# Patient Record
Sex: Male | Born: 1970 | Race: White | Hispanic: No | Marital: Married | Smoking: Never smoker
Health system: Southern US, Community
[De-identification: ages and names within clinical notes are randomized; demographics above are authoritative.]

## PROBLEM LIST (undated history)

## (undated) DIAGNOSIS — E7800 Pure hypercholesterolemia, unspecified: Secondary | ICD-10-CM

## (undated) DIAGNOSIS — L709 Acne, unspecified: Secondary | ICD-10-CM

## (undated) DIAGNOSIS — G4730 Sleep apnea, unspecified: Secondary | ICD-10-CM

## (undated) DIAGNOSIS — E559 Vitamin D deficiency, unspecified: Secondary | ICD-10-CM

## (undated) DIAGNOSIS — K219 Gastro-esophageal reflux disease without esophagitis: Secondary | ICD-10-CM

## (undated) DIAGNOSIS — G4700 Insomnia, unspecified: Secondary | ICD-10-CM

## (undated) DIAGNOSIS — J309 Allergic rhinitis, unspecified: Secondary | ICD-10-CM

## (undated) DIAGNOSIS — M7020 Olecranon bursitis, unspecified elbow: Secondary | ICD-10-CM

## (undated) HISTORY — PX: WISDOM TOOTH EXTRACTION: SHX21

## (undated) HISTORY — DX: Acne, unspecified: L70.9

## (undated) HISTORY — DX: Vitamin D deficiency, unspecified: E55.9

## (undated) HISTORY — DX: Sleep apnea, unspecified: G47.30

## (undated) HISTORY — DX: Olecranon bursitis, unspecified elbow: M70.20

## (undated) HISTORY — DX: Gastro-esophageal reflux disease without esophagitis: K21.9

## (undated) HISTORY — DX: Pure hypercholesterolemia, unspecified: E78.00

## (undated) HISTORY — DX: Allergic rhinitis, unspecified: J30.9

## (undated) HISTORY — DX: Insomnia, unspecified: G47.00

## (undated) HISTORY — PX: INGUINAL HERNIA REPAIR: SUR1180

---

## 2008-06-30 ENCOUNTER — Ambulatory Visit: Payer: Self-pay | Admitting: Radiology

## 2008-06-30 ENCOUNTER — Emergency Department (HOSPITAL_BASED_OUTPATIENT_CLINIC_OR_DEPARTMENT_OTHER): Admission: EM | Admit: 2008-06-30 | Discharge: 2008-06-30 | Payer: Self-pay | Admitting: Emergency Medicine

## 2010-07-21 IMAGING — CR DG RIBS W/ CHEST 3+V*R*
3 series · 3 of 3 positions shown · non-contrast
Comparison: None

CLINICAL DATA: Blunt trauma to right posterior chest/short of
breath

RIGHT RIBS AND CHEST - 3+ VIEW

[w chest pa]
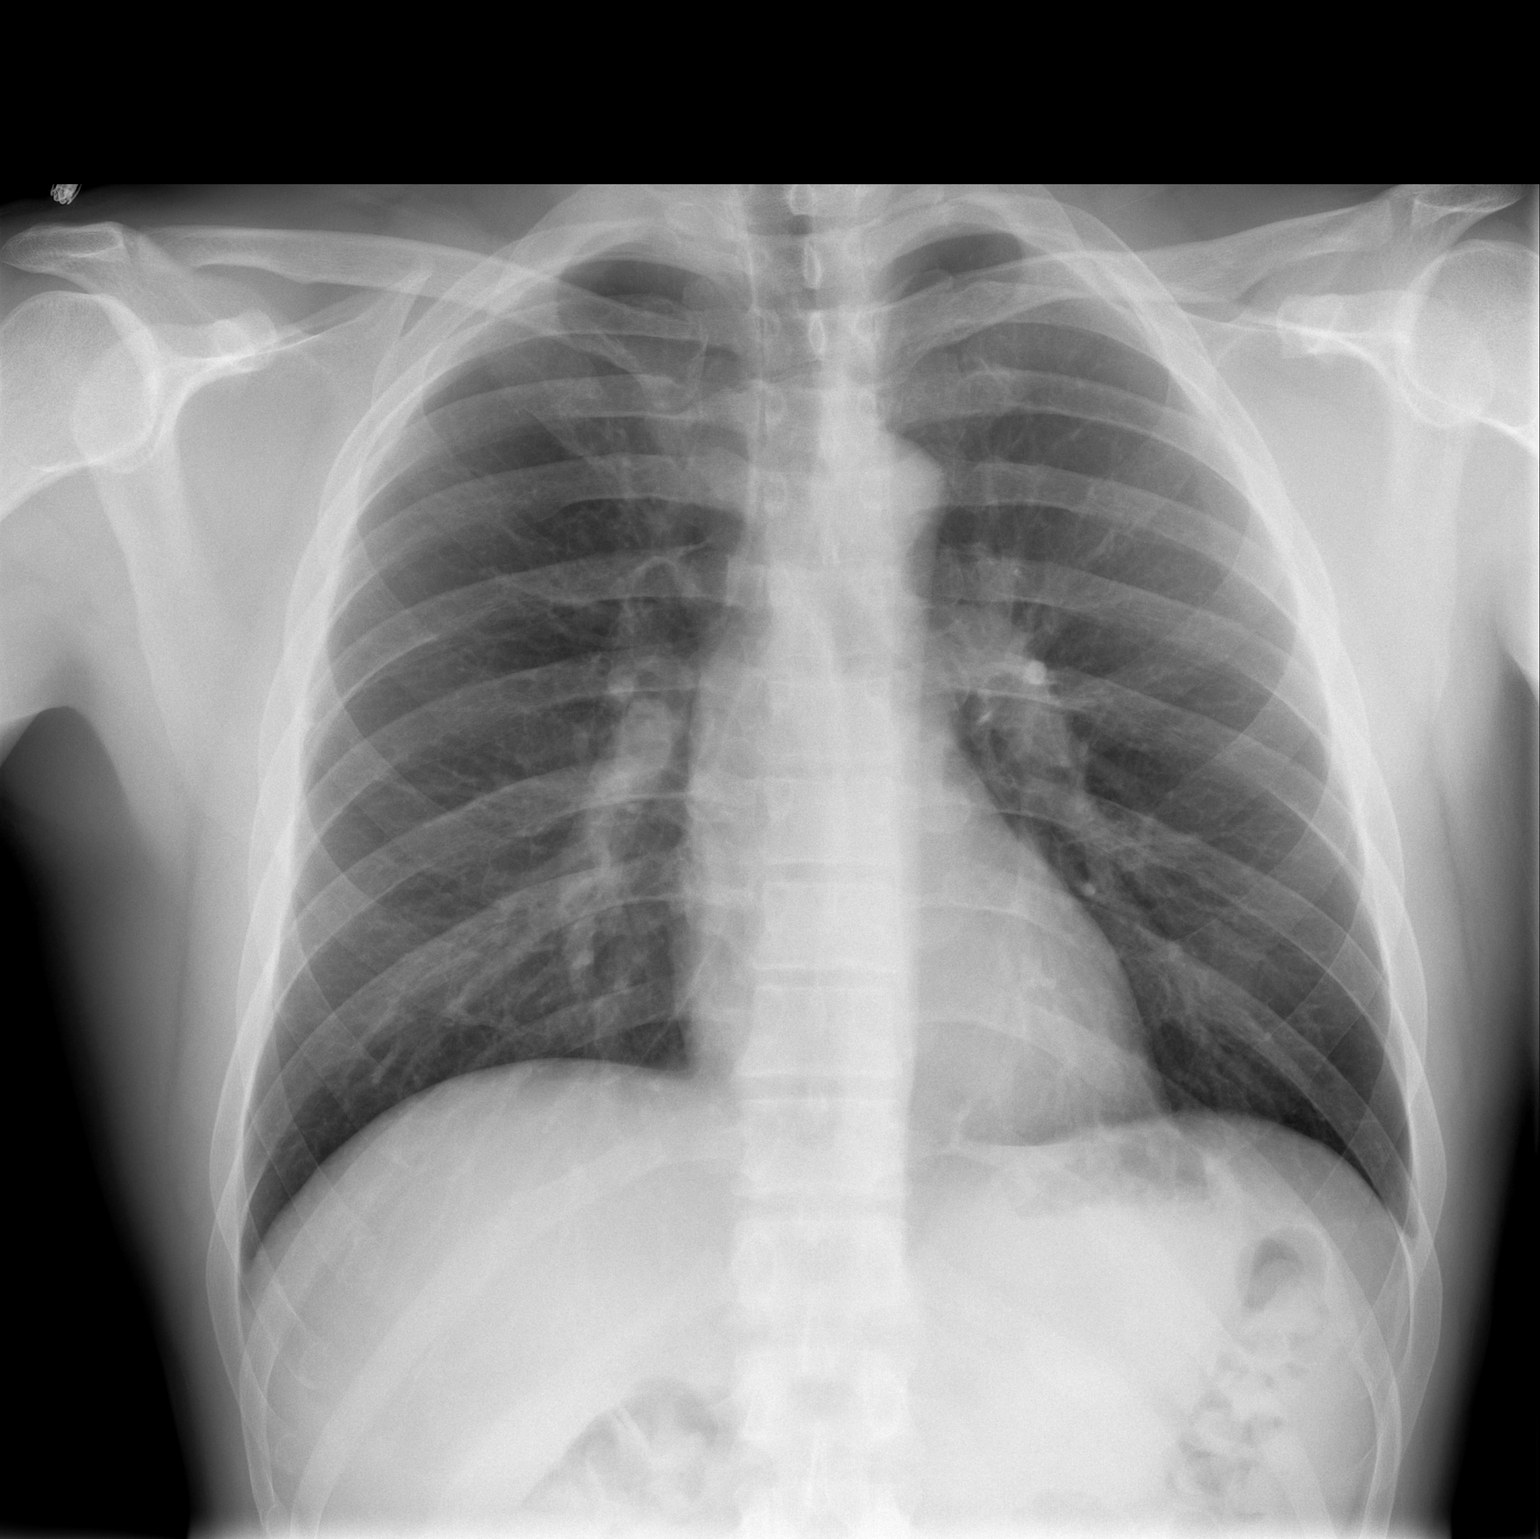

[w ribs ap/pa upper right]
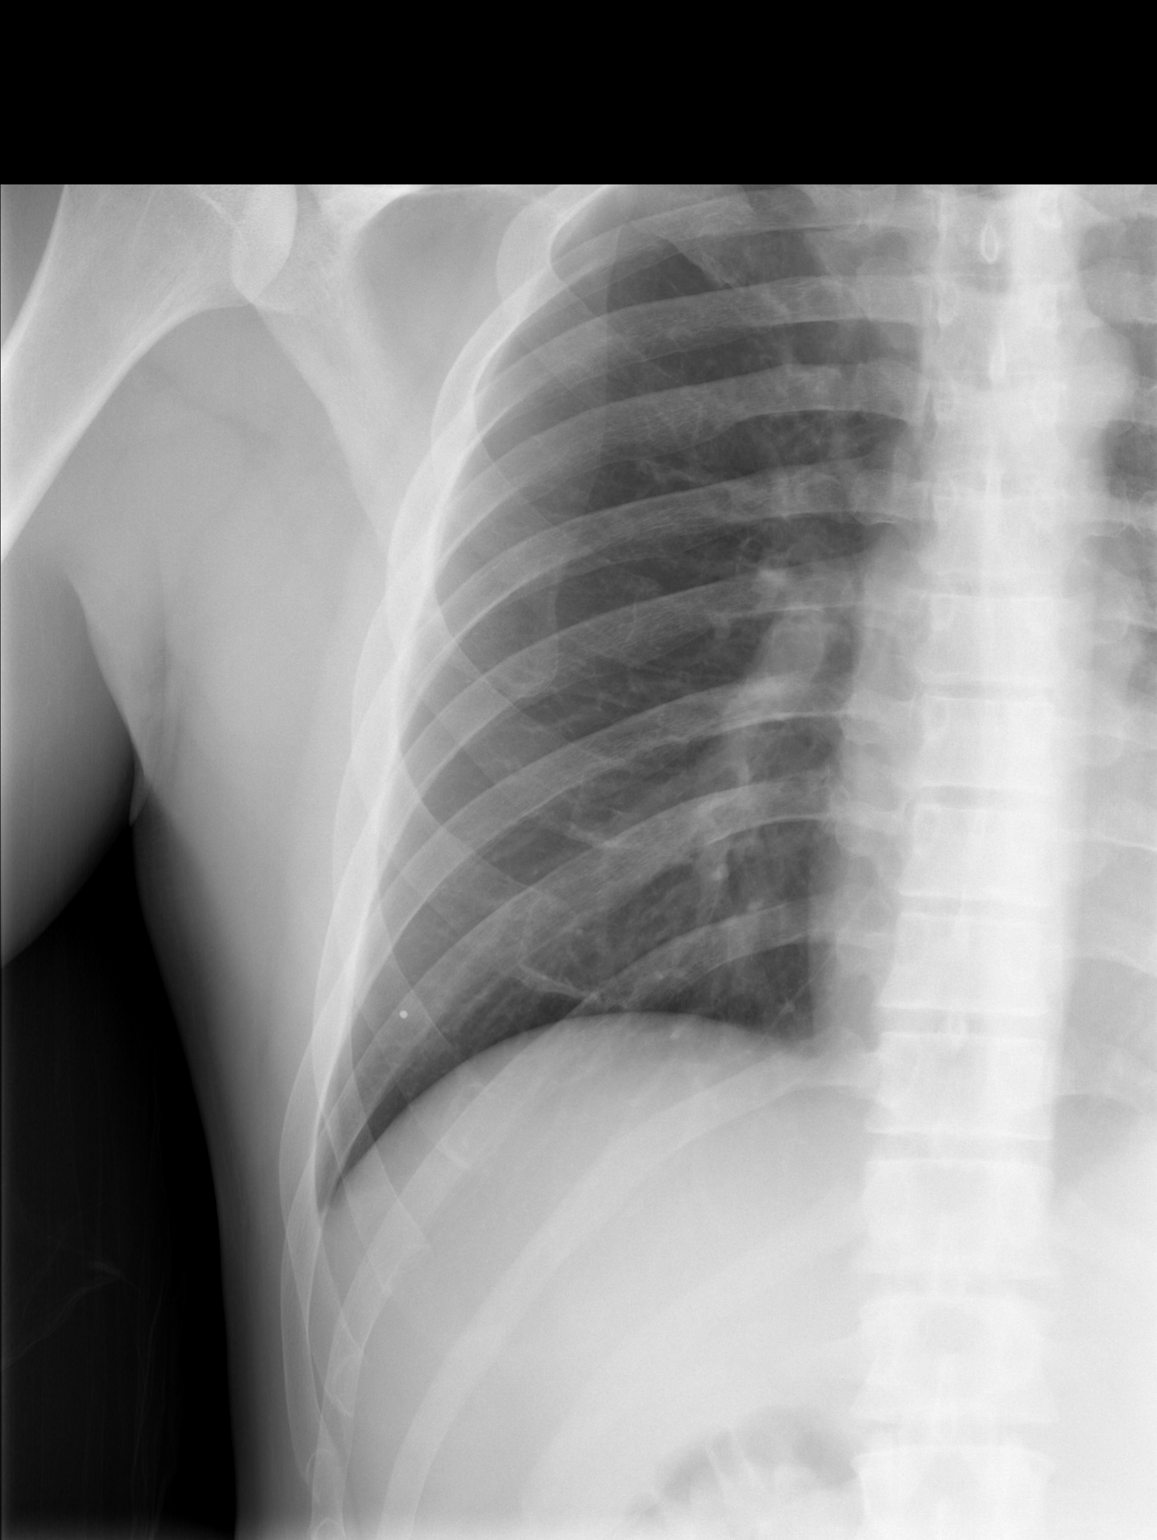

[w ribs oblique right]
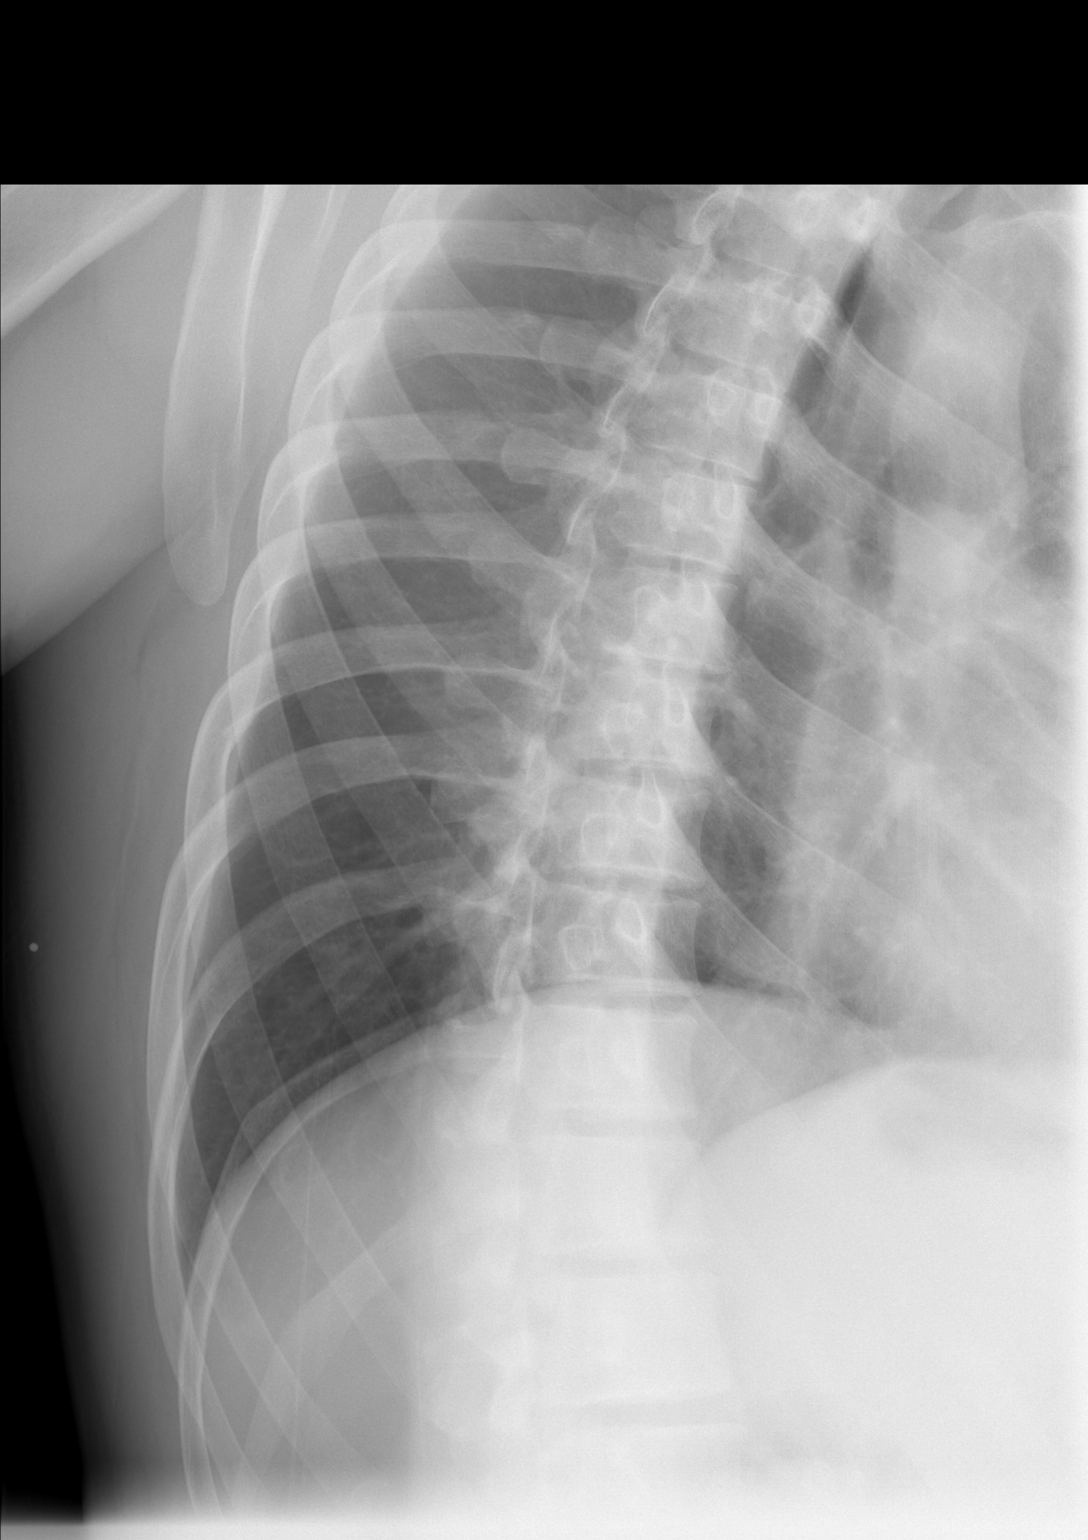

[3 of 3 positions shown; findings below may reference images not displayed]

FINDINGS: Heart and lungs normal.  No rib fractures.  No
pneumothorax or hemothorax.
IMPRESSION: No acute or significant findings.

## 2016-12-05 DIAGNOSIS — L700 Acne vulgaris: Secondary | ICD-10-CM | POA: Diagnosis not present

## 2017-01-03 DIAGNOSIS — Z111 Encounter for screening for respiratory tuberculosis: Secondary | ICD-10-CM | POA: Diagnosis not present

## 2017-02-07 DIAGNOSIS — L700 Acne vulgaris: Secondary | ICD-10-CM | POA: Diagnosis not present

## 2017-02-14 DIAGNOSIS — L700 Acne vulgaris: Secondary | ICD-10-CM | POA: Diagnosis not present

## 2017-04-15 DIAGNOSIS — L700 Acne vulgaris: Secondary | ICD-10-CM | POA: Diagnosis not present

## 2017-05-09 DIAGNOSIS — M9903 Segmental and somatic dysfunction of lumbar region: Secondary | ICD-10-CM | POA: Diagnosis not present

## 2017-05-09 DIAGNOSIS — M9901 Segmental and somatic dysfunction of cervical region: Secondary | ICD-10-CM | POA: Diagnosis not present

## 2017-05-09 DIAGNOSIS — M9905 Segmental and somatic dysfunction of pelvic region: Secondary | ICD-10-CM | POA: Diagnosis not present

## 2017-05-09 DIAGNOSIS — M9902 Segmental and somatic dysfunction of thoracic region: Secondary | ICD-10-CM | POA: Diagnosis not present

## 2017-05-14 DIAGNOSIS — M9905 Segmental and somatic dysfunction of pelvic region: Secondary | ICD-10-CM | POA: Diagnosis not present

## 2017-05-14 DIAGNOSIS — M9902 Segmental and somatic dysfunction of thoracic region: Secondary | ICD-10-CM | POA: Diagnosis not present

## 2017-05-14 DIAGNOSIS — M9903 Segmental and somatic dysfunction of lumbar region: Secondary | ICD-10-CM | POA: Diagnosis not present

## 2017-05-14 DIAGNOSIS — M9901 Segmental and somatic dysfunction of cervical region: Secondary | ICD-10-CM | POA: Diagnosis not present

## 2017-06-23 DIAGNOSIS — I83813 Varicose veins of bilateral lower extremities with pain: Secondary | ICD-10-CM | POA: Diagnosis not present

## 2017-06-25 DIAGNOSIS — M545 Low back pain: Secondary | ICD-10-CM | POA: Diagnosis not present

## 2017-06-25 DIAGNOSIS — G4700 Insomnia, unspecified: Secondary | ICD-10-CM | POA: Diagnosis not present

## 2017-06-25 DIAGNOSIS — R0683 Snoring: Secondary | ICD-10-CM | POA: Diagnosis not present

## 2017-06-25 DIAGNOSIS — Z0000 Encounter for general adult medical examination without abnormal findings: Secondary | ICD-10-CM | POA: Diagnosis not present

## 2017-07-01 DIAGNOSIS — M79605 Pain in left leg: Secondary | ICD-10-CM | POA: Diagnosis not present

## 2017-07-01 DIAGNOSIS — I83811 Varicose veins of right lower extremities with pain: Secondary | ICD-10-CM | POA: Diagnosis not present

## 2017-07-01 DIAGNOSIS — I83813 Varicose veins of bilateral lower extremities with pain: Secondary | ICD-10-CM | POA: Diagnosis not present

## 2017-07-01 DIAGNOSIS — M79604 Pain in right leg: Secondary | ICD-10-CM | POA: Diagnosis not present

## 2017-07-02 DIAGNOSIS — M9901 Segmental and somatic dysfunction of cervical region: Secondary | ICD-10-CM | POA: Diagnosis not present

## 2017-07-02 DIAGNOSIS — M9905 Segmental and somatic dysfunction of pelvic region: Secondary | ICD-10-CM | POA: Diagnosis not present

## 2017-07-02 DIAGNOSIS — M9902 Segmental and somatic dysfunction of thoracic region: Secondary | ICD-10-CM | POA: Diagnosis not present

## 2017-07-02 DIAGNOSIS — M9903 Segmental and somatic dysfunction of lumbar region: Secondary | ICD-10-CM | POA: Diagnosis not present

## 2017-07-22 DIAGNOSIS — I83813 Varicose veins of bilateral lower extremities with pain: Secondary | ICD-10-CM | POA: Diagnosis not present

## 2017-07-29 DIAGNOSIS — D1801 Hemangioma of skin and subcutaneous tissue: Secondary | ICD-10-CM | POA: Diagnosis not present

## 2017-07-29 DIAGNOSIS — L918 Other hypertrophic disorders of the skin: Secondary | ICD-10-CM | POA: Diagnosis not present

## 2017-08-25 DIAGNOSIS — M9903 Segmental and somatic dysfunction of lumbar region: Secondary | ICD-10-CM | POA: Diagnosis not present

## 2017-08-25 DIAGNOSIS — M9905 Segmental and somatic dysfunction of pelvic region: Secondary | ICD-10-CM | POA: Diagnosis not present

## 2017-08-25 DIAGNOSIS — M9901 Segmental and somatic dysfunction of cervical region: Secondary | ICD-10-CM | POA: Diagnosis not present

## 2017-08-25 DIAGNOSIS — M9902 Segmental and somatic dysfunction of thoracic region: Secondary | ICD-10-CM | POA: Diagnosis not present

## 2017-09-09 ENCOUNTER — Encounter: Payer: Self-pay | Admitting: Neurology

## 2017-09-09 ENCOUNTER — Ambulatory Visit (INDEPENDENT_AMBULATORY_CARE_PROVIDER_SITE_OTHER): Payer: BLUE CROSS/BLUE SHIELD | Admitting: Neurology

## 2017-09-09 VITALS — BP 123/72 | HR 50 | Ht 74.0 in | Wt 194.0 lb

## 2017-09-09 DIAGNOSIS — R0683 Snoring: Secondary | ICD-10-CM | POA: Insufficient documentation

## 2017-09-09 DIAGNOSIS — F5103 Paradoxical insomnia: Secondary | ICD-10-CM | POA: Diagnosis not present

## 2017-09-09 NOTE — Progress Notes (Signed)
SLEEP MEDICINE CLINIC   Provider:  Melvyn Novas, M D  Primary Care Physician:  Dorothyann Peng, MD   Referring Provider: Dorothyann Peng, MD    Chief Complaint  Patient presents with  . New Patient (Initial Visit)    pt alone, rm 11. pt states that he doesnt have difficulty with going to sleep but is struggling staying asleep. pt states that he typically wakes up during the night and then has a hard time with going back to sleep. pt has been told he snores in sleep    HPI:  Curtis [name] is a 47 y.o. caucasian, right handed  male patient and seen here  in a referral from Dr. Allyne Gee for a sleep medicine consultation.   Chief complaint according to patient : "Increasingly fatigued, and sleepy. I have wake up early and can't go back to sleep ". He works for [employer] as a Psychologist, counselling.   Mr. Pellicane reports that he has noted certain cycles or phases of sleep changes.  There will be times where he wakes up at the exact time every morning for example 3; 40 minutes AM and after a couple of weeks or so this again will change.  It has never been difficult for him to go to sleep in the first place but if he goes to early he also wakes up early.  He craves to be able to sleep 7 or 8 hours but this has rarely happened. He hasn't slept in daytime , has never been a "napper".  Sleep habits are as follows: The patient aims for a bedtime between 10 and 11 PM, if he would go to sleep earlier he would wake up earlier and he always avoids going to sleep later than midnight. He feels pretty good at this routine. Bedroom is cool , quiet and dark. He shares the bedroom with his wife, but he feels usually better when alone in bed. He sleeps poorly in hotels.  Kids are 8 and 11, no pets in the bedroom. Bathroom break will wake him up some days, mostly around 3 or 4 AM.  He has vivid dreams- but feels these don't wake him, no pain or discomfort that interrupts his sleep. Used to have some low back pain that has  resolved.   No enactment, no sleep . Wife reports snoring, mildly and more after alcohol ( weekends ) and only on his back.  He wakes at 6. 30 AM spontaneously. He is always able to wake without alarm. He feels refreshed some days, only after at least 6 hours of sleep. Often he is fatigued.   Sleep medical history and family sleep history: father was a snorer, lost weight- CPAP user. Mother is a Agricultural consultant. One brother who is  younger by 3.5 years sleeps well.    Social history: he practices good sleep hygiene. He doesn't have electronics in the bedroom- married, 2 children in school age. Sales rep , travels by car.  Non smoker, exerciser, ETOH- wine or beer on weekends, 2-3 beers per WE evening, caffeine - no soda, 2 cups of coffee in AM, sweet tea at lunch.     Review of Systems: Out of a complete 14 system review, the patient complains of only the following symptoms, and all other reviewed systems are negative. Snoring, early morning waking up- 10 out of 14 days.   Epworth score 2/ 24  , Fatigue severity score 23/ 63 , depression score n/a    Social History  Socioeconomic History  . Marital status: Married    Spouse name: Not on file  . Number of children: Not on file  . Years of education: Not on file  . Highest education level: Not on file  Occupational History  . Not on file  Social Needs  . Financial resource strain: Not on file  . Food insecurity:    Worry: Not on file    Inability: Not on file  . Transportation needs:    Medical: Not on file    Non-medical: Not on file  Tobacco Use  . Smoking status: Never Smoker  . Smokeless tobacco: Never Used  Substance and Sexual Activity  . Alcohol use: Not on file  . Drug use: Never  . Sexual activity: Not on file  Lifestyle  . Physical activity:    Days per week: Not on file    Minutes per session: Not on file  . Stress: Not on file  Relationships  . Social connections:    Talks on phone: Not on file    Gets  together: Not on file    Attends religious service: Not on file    Active member of club or organization: Not on file    Attends meetings of clubs or organizations: Not on file    Relationship status: Not on file  . Intimate partner violence:    Fear of current or ex partner: Not on file    Emotionally abused: Not on file    Physically abused: Not on file    Forced sexual activity: Not on file  Other Topics Concern  . Not on file  Social History Narrative  . Not on file    Family History  Problem Relation Age of Onset  . Hypertension Father   . High Cholesterol Father     Past Medical History:  Diagnosis Date  . Acne   . Allergic rhinitis   . GERD (gastroesophageal reflux disease)   . Insomnia   . Olecranon bursitis   . Pure hypercholesterolemia   . Vitamin D deficiency     Past Surgical History:  Procedure Laterality Date  . INGUINAL HERNIA REPAIR Right   . WISDOM TOOTH EXTRACTION      Current Outpatient Medications  Medication Sig Dispense Refill  . Cholecalciferol (VITAMIN D) 2000 units CAPS Take 1 capsule by mouth daily.    . Omega-3 Fatty Acids (FISH OIL) 500 MG CAPS Take 1 capsule by mouth daily.    . rosuvastatin (CRESTOR) 5 MG tablet Take 5 mg by mouth daily.    . temazepam (RESTORIL) 15 MG capsule Take 15 mg by mouth at bedtime.     No current facility-administered medications for this visit.     Allergies as of 09/09/2017  . (No Known Allergies)    Vitals: BP 123/72   Pulse (!) 50   Ht 6\' 2"  (1.88 m)   Wt 194 lb (88 kg)   BMI 24.91 kg/m  Last Weight:  Wt Readings from Last 1 Encounters:  09/09/17 194 lb (88 kg)   WJX:BJYNBMI:Body mass index is 24.91 kg/m.     Last Height:   Ht Readings from Last 1 Encounters:  09/09/17 6\' 2"  (1.88 m)    Physical exam:  General: The patient is awake, alert and appears not in acute distress. The patient is well groomed. Head: Normocephalic, atraumatic. Neck is supple. Mallampati 3,  neck circumference:16" Nasal  airflow patent ,  no Retrognathia is seen.  Cardiovascular:  Regular rate  and rhythm , without  murmurs or carotid bruit, and without distended neck veins. Respiratory: Lungs are clear to auscultation. Skin:  Without evidence of edema, or rash Trunk: BMI is 24.9. The patient's posture is erect. Neurologic exam : The patient is awake and alert, oriented to place and time.   Memory subjective  described as intact.  Attention span & concentration ability appears normal.  Speech is fluent,  without dysarthria, dysphonia or aphasia.  Mood and affect are appropriate.  Cranial nerves: Pupils are equal and briskly reactive to light. Funduscopic exam without  evidence of pallor or edema. Extraocular movements  in vertical and horizontal planes intact and without nystagmus. Visual fields by finger perimetry are intact. Hearing to finger rub intact.  Facial sensation intact to fine touch. Facial motor strength is symmetric and tongue and uvula move midline. Shoulder shrug was symmetrical.   Motor exam:   Normal tone, muscle bulk and symmetric strength in all extremities. Sensory:  Fine touch, pinprick and vibration were tested in all extremities.  Coordination: Rapid alternating movements / Finger-to-nose maneuver  normal without evidence of ataxia, dysmetria or tremor. Gait and station: Patient walks without assistive device. Deep tendon reflexes: in the  upper and lower extremities are symmetric and intact.    Assessment:  After physical and neurologic examination, review of laboratory studies,  Personal review of imaging studies, reports of other /same  Imaging studies, results of polysomnography and / or neurophysiology testing and pre-existing records as far as provided in visit., my assessment is   1)  Early awakening insomnia is present on more days than not. Usually in early AM-  Melatonin has helped a little- but he only noted improvement with temazepam.   2)  Organic - physiological  reasons for early waking . - attended sleep study.  Rule out apnea, hypoxia and PLms.   The patient was advised of the nature of the diagnosed disorder , the treatment options and the  risks for general health and wellness arising from not treating the condition.   I spent more than 45 minutes of face to face time with the patient.  Greater than 50% of time was spent in counseling and coordination of care. We have discussed the diagnosis and differential and I answered the patient's questions.    Plan:  Treatment plan and additional workup :  Attended sleep study to evaluate insomnia, arousals related to snoring, OSA, pain, PLMs, hypoxemia, cardiac arrhythmia etc. .    Melvyn Novas, MD 09/09/2017, 9:21 AM  Certified in Neurology by ABPN Certified in Sleep Medicine by [facility] [address] [place], [place] [address]

## 2017-09-29 ENCOUNTER — Ambulatory Visit (INDEPENDENT_AMBULATORY_CARE_PROVIDER_SITE_OTHER): Payer: BLUE CROSS/BLUE SHIELD | Admitting: Neurology

## 2017-09-29 DIAGNOSIS — G4731 Primary central sleep apnea: Secondary | ICD-10-CM

## 2017-09-29 DIAGNOSIS — R0683 Snoring: Secondary | ICD-10-CM

## 2017-09-29 DIAGNOSIS — F5103 Paradoxical insomnia: Secondary | ICD-10-CM

## 2017-10-03 NOTE — Procedures (Signed)
PATIENT'S NAME:  Curtis [name], Curtis [name] DOB:      1970/06/19      MRN:    [id number]     DATE OF RECORDING: 09/29/2017/ [facility] REFERRING M.D.:  Dorothyann Peng, M.D. Study Performed:   Baseline Polysomnogram with expanded EEG montage  HISTORY:  47 year old Caucasian male with increasing fatigue and sleepiness, reports problems to stay asleep, has tried OTC and prescription sleep aids.   The patient endorsed the Epworth Sleepiness Scale at 2/24 points, FSS at 23/63 points.   The patient's weight 194 pounds with a height of 74 (inches), resulting in a BMI of 24.9 kg/m2. The patient's neck circumference measured 16 inches.  CURRENT MEDICATIONS: Vitamin D, Omega 3, Crestor, Restoril.   PROCEDURE:  This is a multichannel digital polysomnogram utilizing the Somnostar 11.2 system.  Electrodes and sensors were applied and monitored per AASM Specifications.   EEG, EOG, Chin and Limb EMG, were sampled at 200 Hz.  ECG, Snore and Nasal Pressure, Thermal Airflow, Respiratory Effort, CPAP Flow and Pressure, Oximetry was sampled at 50 Hz. Digital video and audio were recorded.      BASELINE STUDY: Lights Out was at 21:08 and Lights On at 05:02.  Total recording time (TRT) was 474.5 minutes, with a total sleep time (TST) of 390 minutes.   The patient's sleep latency was 67.5 minutes.  REM latency was 118 minutes.  The sleep efficiency was 82.2 %.     SLEEP ARCHITECTURE: WASO (Wake after sleep onset) was 35 minutes.  There were 6 minutes in Stage N1, 269.5 minutes Stage N2, 67.5 minutes Stage N3 and 47 minutes in Stage REM.  The percentage of Stage N1 was 1.5%, Stage N2 was 69.1%, Stage N3 was 17.3% and Stage R (REM sleep) was 12.1%.   RESPIRATORY ANALYSIS:  There were a total of 114 respiratory events:  10 obstructive apneas, 58 central apneas and 2 mixed apneas with a total of 70 apneas and an apnea index (AI) of 10.8 /hour. There were 44 hypopneas with a hypopnea index of 6.8 /hour. The patient also had 0  respiratory event related arousals (RERAs). The total APNEA/HYPOPNEA INDEX (AHI) was 17.5/hour and the total RESPIRATORY DISTURBANCE INDEX was 17.5 /hour.  2 events occurred in REM sleep and 145 events in NREM. The REM AHI was 2.6 /hour, versus a non-REM AHI of 19.6. The patient spent 199.5 minutes of total sleep time in the supine position and 191 minutes in non-supine. The supine AHI was 28.5/h, versus a non-supine AHI of 6.0.  OXYGEN SATURATION & C02:  The Wake baseline 02 saturation was 94%, with the lowest being 87%. Time spent below 89% saturation equaled 4 minutes. Average End Tidal CO2 during sleep was not recorded.   PERIODIC LIMB MOVEMENTS:   The patient had a total of 12 Periodic Limb Movements.  The Periodic Limb Movement (PLM) index was 1.8 and the PLM Arousal index was 0.75 /hour. The arousals were noted as: 72 were spontaneous, 5 were associated with PLMs, and 48 were associated with respiratory events.  Audio and video analysis did not show any abnormal or unusual movements, behaviors, phonations or vocalizations. Snoring was noted. We were unable to see leg movements for much of the second half of the sleep study due to artefact.   EKG was bradycardic in keeping with sinus rhythm (Sinus-brady). Sinus bradycardia at 40 bpm was evident in supine NREM sleep with frequent cyclic central apneas.   Post-study, the patient indicated that sleep was the  same as usual.   IMPRESSION: No prolonged periods of wakefulness during sleep study.   1. 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 Almost no apnea and hypopnea in REM sleep. This is a pattern of Central Sleep Apnea and associated with bradycardia. This was not a response to hypoxemia- there was no significant or prolonged hypoxia noted.  2. Mild Periodic Limb Movement Disorder (PLMD). 3. Primary Snoring in supine sleep.  4. Abnormally slow EKG.   RECOMMENDATIONS:  1. Advise work up for U[employer] CorporationComplex Central Apnea, cyclic breathing,  bradycardia. This involves a return for a PAP evaluation, and avoiding supine sleep in the meantime.    I certify that I have reviewed the entire raw data recording prior to the issuance of this report in accordance with the Standards of Accreditation of the American Academy of Sleep Medicine (AASM)    Melvyn Novasarmen Grant Henkes, MD      10-03-2017  Diplomat, American Board of Psychiatry and Neurology  Diplomat, American Board of Sleep Medicine Medical Director, [facility]

## 2017-10-03 NOTE — Addendum Note (Signed)
Addended by: Melvyn NovasHMEIER, Darnell Stimson on: 10/03/2017 12:51 PM   Modules accepted: Orders

## 2017-10-08 ENCOUNTER — Telehealth: Payer: Self-pay | Admitting: Neurology

## 2017-10-08 NOTE — Telephone Encounter (Signed)
Called patient to discuss sleep study results. No answer at this time. LVM for the patient to call back.   

## 2017-10-08 NOTE — Telephone Encounter (Signed)
Pt returned call. I advised pt that Dr. Dohmeier reviewed their sleep study results and found that has complex sleep apnea and recommends that pt be treated with a cpap. Dr. Dohmeier recommends that pt return for a repeat sleep study in order to properly titrate the cpap and ensure a good mask fit. Pt is agreeable to returning for a titration study. I advised pt that our sleep lab will file with pt's insurance and call pt to schedule the sleep study when we hear back from the pt's insurance regarding coverage of this sleep study. Pt verbalized understanding of results. Pt had no questions at this time but was encouraged to call back if questions arise.   

## 2017-10-08 NOTE — Telephone Encounter (Signed)
-----   Message from Melvyn Novas, MD sent at 10/03/2017 12:51 PM EDT ----- IMPRESSION: No prolonged periods of wakefulness during sleep  study.   1. 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 Almost no  apnea and hypopnea in REM sleep. This is a pattern of Central  Sleep Apnea and associated with bradycardia. This was not a  response to hypoxemia- there was no significant or prolonged  hypoxia noted.  2. Mild Periodic Limb Movement Disorder (PLMD). 3. Primary Snoring in supine sleep.  4. Abnormally slow EKG.   RECOMMENDATIONS:  1. Advise work up for [employer] Apnea, cyclic breathing,  bradycardia. This involves a return for a PAP evaluation, and  avoiding supine sleep in the meantime.

## 2017-10-13 DIAGNOSIS — M9903 Segmental and somatic dysfunction of lumbar region: Secondary | ICD-10-CM | POA: Diagnosis not present

## 2017-10-13 DIAGNOSIS — M9902 Segmental and somatic dysfunction of thoracic region: Secondary | ICD-10-CM | POA: Diagnosis not present

## 2017-10-13 DIAGNOSIS — M9905 Segmental and somatic dysfunction of pelvic region: Secondary | ICD-10-CM | POA: Diagnosis not present

## 2017-10-13 DIAGNOSIS — M9901 Segmental and somatic dysfunction of cervical region: Secondary | ICD-10-CM | POA: Diagnosis not present

## 2017-11-10 ENCOUNTER — Ambulatory Visit (INDEPENDENT_AMBULATORY_CARE_PROVIDER_SITE_OTHER): Payer: BLUE CROSS/BLUE SHIELD | Admitting: Neurology

## 2017-11-10 DIAGNOSIS — F5103 Paradoxical insomnia: Secondary | ICD-10-CM

## 2017-11-10 DIAGNOSIS — R0683 Snoring: Secondary | ICD-10-CM

## 2017-11-10 DIAGNOSIS — G4731 Primary central sleep apnea: Secondary | ICD-10-CM | POA: Diagnosis not present

## 2017-11-14 NOTE — Addendum Note (Signed)
Addended by: Melvyn Novas on: 11/14/2017 09:55 AM   Modules accepted: Orders

## 2017-11-14 NOTE — Procedures (Signed)
PATIENT'S NAME:  Curtis [name], Curtis [name] DOB:      1970-11-30      MR#:    [id number]     DATE OF RECORDING: 11/10/2017 , [facility] REFERRING M.D.:  Dorothyann Peng, M.D. Study Performed:   Titration to Positive Airway Pressure HISTORY:  Curtis [name] returned for a CPAP Titration study, following an expanded EEG - PSG study from 09/29/17, which resulted in a diagnosis of cyclic breathing, Central Sleep Apnea without hypoxemia. The patient had an AHI of 17.5/h overall, supine AHI of 28.5/h, and no REM sleep accentuation (typical for Cheyne Stokes breathing). The SpO2 nadir of 87%. The patient endorsed the Epworth Sleepiness Scale at 2/24 points and the Fatigue Score at 23/63 points.   The patient's weight 194 pounds with a height of 74 (inches), resulting in a BMI of 24.9 kg/m2. The patient's neck circumference measured 16 inches.  CURRENT MEDICATIONS: Vitamin D, Omega 3, Crestor, Restoril.   PROCEDURE:  This is a multichannel digital polysomnogram utilizing the SomnoStar 11.2 system.  Electrodes and sensors were applied and monitored per AASM Specifications.   EEG, EOG, Chin and Limb EMG, were sampled at 200 Hz.  ECG, Snore and Nasal Pressure, Thermal Airflow, Respiratory Effort, CPAP Flow and Pressure, Oximetry was sampled at 50 Hz. Digital video and audio were recorded.      CPAP was initiated at 5 cmH20 with heated humidity per AASM split night standards and pressure was advanced to 12cmH20 because of hypopneas, apneas and desaturations.  At a PAP pressure of 8 cmH20, there was a reduction of the AHI to 0.0 with 100% sleep efficiency, Nadir at 93% SpO2, significant improvement of sleep apnea. A SIMPLUS FFM in large was used.   Lights Out was at 22:04 and Lights On at 05:00. Total recording time (TRT) was 416.5 minutes, with a total sleep time (TST) of 313.5 minutes. The patient's sleep latency was 39 minutes. REM latency was 88.5 minutes.  The sleep efficiency was 75.3 %.    SLEEP ARCHITECTURE: WASO (Wake after sleep  onset) was 29.5 minutes.  There were 10.5 minutes in Stage N1, 194 minutes Stage N2, 58.5 minutes Stage N3 and 50.5 minutes in Stage REM.  The percentage of Stage N1 was 3.3%, Stage N2 was 61.9%, Stage N3 was 18.7% and Stage R (REM sleep) was 16.1%.   RESPIRATORY ANALYSIS:  There was a total of 27 respiratory events: 8 obstructive apneas, 11 central apneas and 0 mixed apneas with 8 hypopneas.     The total APNEA/HYPOPNEA INDEX (AHI) was 5.2 /hour. 0 events occurred in REM sleep and 27 events in NREM. The REM AHI was 0.0 /hour versus a non-REM AHI of 6.2 /hour.  The patient spent 236.5 minutes of total sleep time in the supine position and 77 minutes in non-supine. The supine AHI was 5.1/h, versus a non-supine AHI of 5.5/h.  OXYGEN SATURATION & C02:  The baseline 02 saturation was 92%, with the lowest being 87%. Time spent below 89% saturation equaled 1 minute.  PERIODIC LIMB MOVEMENTS:  The patient had a total of 6 Periodic Limb Movements. The Periodic Limb Movement (PLM) index was 1.1 and the PLM Arousal index was 0 /hour.  Audio and video analysis did not show any abnormal or unusual movements, behaviors, phonations or vocalizations.  EKG with isolated PACs was in keeping with normal sinus rhythm (NSR), heart rate of over 50 bpm. Post-study, the patient indicated that sleep was better than usual.    DIAGNOSIS 1. Central Complex Sleep  Apnea improved under 7 and 8 cm water and worsened again at higher pressures (CPAP explored up to 12 cm water). 2. CPAP was well tolerated, a FFM was used, SIMPLUS, in large size.    PLANS/RECOMMENDATIONS: 1. CPAP therapy compliance is defined as 4 hours or more of nightly use. 2. The patient should avoid sedatives, hypnotics, and alcoholic beverage consumption before bedtime. DISCUSSION: I ordered an autotitration capable CPAP machine with 5-9 cm water pressure and 1 cm EPR. CPAP was well tolerated, a FFM was used, SIMPLUS, in large size.   A follow up  appointment will be scheduled in the Sleep Clinic at [facility].   Please call [phone] with any questions.     I certify that I have reviewed the entire raw data recording prior to the issuance of this report in accordance with the Standards of Accreditation of the American Academy of Sleep Medicine (AASM)    Melvyn Novas, M.D.   11-14-2017  Diplomat, American Board of Psychiatry and Neurology  Diplomat, American Board of Sleep Medicine Medical Director, [facility]

## 2017-11-18 ENCOUNTER — Telehealth: Payer: Self-pay | Admitting: Neurology

## 2017-11-18 NOTE — Telephone Encounter (Signed)
Called patient to discuss sleep study results. No answer at this time. LVM for the patient to call back.   

## 2017-11-18 NOTE — Telephone Encounter (Signed)
Pt returning RN's call.

## 2017-11-18 NOTE — Telephone Encounter (Signed)
-----   Message from Melvyn Novas, MD sent at 11/14/2017  9:55 AM EDT ----- DIAGNOSIS 1. Central Complex Sleep Apnea improved under 7 and 8 cm water  and worsened again at higher pressures (CPAP explored up to 12 cm  water). 2. CPAP was well tolerated, a FFM was used, SIMPLUS, in large  size.    PLANS/RECOMMENDATIONS: 1. CPAP therapy compliance is defined as 4 hours or more of  nightly use. 2. The patient should avoid sedatives, hypnotics, and alcoholic  beverage consumption before bedtime. DISCUSSION: I ordered an autotitration capable CPAP machine with  5-9 cm water pressure and 1 cm EPR. CPAP was well tolerated, a  FFM was used, SIMPLUS, in large size.

## 2017-11-20 NOTE — Telephone Encounter (Signed)
I called pt. I advised pt that Dr. Vickey Huger reviewed their sleep study results and found that pt has sleep apnea that was well treated at a pressure of 8 cm water pressure on CPAP. Dr. Dr. Vickey Huger recommends that pt starts a auto CPAP 5-9 cm water pressure. I reviewed PAP compliance expectations with the pt. Pt is agreeable to starting a CPAP. I advised pt that an order will be sent to a DME, [facility], and [facility] will call the pt within about one week after they file with the pt's insurance. [facility] will show the pt how to use the machine, fit for masks, and troubleshoot the CPAP if needed. A follow up appt was made for insurance purposes with Dr. Vickey Huger on Jan 15,2019 at 8:30 am. Pt verbalized understanding to arrive 15 minutes early and bring their CPAP. A letter with all of this information in it will be mailed to the pt as a reminder. I verified with the pt that the address we have on file is correct. Pt verbalized understanding of results. Pt had no questions at this time but was encouraged to call back if questions arise. I have sent the order to [facility] and have received confirmation that they have received the order.

## 2017-12-03 ENCOUNTER — Telehealth: Payer: Self-pay | Admitting: Neurology

## 2017-12-03 NOTE — Telephone Encounter (Signed)
Pt called me back. He wanted to know his AHI for his diagnostic sleep study. I advised him that per Dr. Oliva Bustard interpretation of his sleep study, "The total APNEA/HYPOPNEA INDEX (AHI) was 17.5/hour ."  Pt verbalized understanding and appreciation.

## 2017-12-03 NOTE — Telephone Encounter (Signed)
Pt is asking for a call in response to some information off of his sleep study, please call

## 2017-12-03 NOTE — Telephone Encounter (Signed)
I called pt to discuss. No answer, left a message asking him to call me back. It would be helpful to know which specific questions he has so I can look into those answers before calling him back.

## 2017-12-29 ENCOUNTER — Encounter: Payer: Self-pay | Admitting: Internal Medicine

## 2017-12-29 ENCOUNTER — Ambulatory Visit: Payer: BLUE CROSS/BLUE SHIELD | Admitting: Internal Medicine

## 2017-12-29 VITALS — BP 112/74 | HR 53 | Temp 97.6°F | Ht 74.0 in | Wt 197.6 lb

## 2017-12-29 DIAGNOSIS — F5101 Primary insomnia: Secondary | ICD-10-CM | POA: Insufficient documentation

## 2017-12-29 DIAGNOSIS — G4731 Primary central sleep apnea: Secondary | ICD-10-CM

## 2017-12-29 DIAGNOSIS — E7800 Pure hypercholesterolemia, unspecified: Secondary | ICD-10-CM

## 2017-12-29 DIAGNOSIS — Z79899 Other long term (current) drug therapy: Secondary | ICD-10-CM | POA: Diagnosis not present

## 2017-12-29 DIAGNOSIS — K648 Other hemorrhoids: Secondary | ICD-10-CM

## 2017-12-29 DIAGNOSIS — G4739 Other sleep apnea: Secondary | ICD-10-CM

## 2017-12-29 NOTE — Patient Instructions (Signed)

## 2017-12-29 NOTE — Progress Notes (Signed)
  Subjective:     Patient ID: Curtis [name] , male    DOB: [birth year] , 47 y.o.   MRN: [id number]   Chief Complaint  Patient presents with  . Hyperlipidemia    HPI  Hyperlipidemia  This is a chronic problem. The current episode started more than 1 year ago. The problem is controlled. Recent lipid tests were reviewed and are normal. Current antihyperlipidemic treatment includes statins. The current treatment provides moderate improvement of lipids.   He reports compliance with meds. He has not had any issues with the medication.   Past Medical History:  Diagnosis Date  . Acne   . Allergic rhinitis   . GERD (gastroesophageal reflux disease)   . Insomnia   . Olecranon bursitis   . Pure hypercholesterolemia   . Vitamin D deficiency      Family History  Problem Relation Age of Onset  . Hypertension Father   . High Cholesterol Father      Current Outpatient Medications:  .  Cholecalciferol (VITAMIN D) 2000 units CAPS, Take 1 capsule by mouth daily., Disp: , Rfl:  .  Omega-3 Fatty Acids (FISH OIL) 500 MG CAPS, Take 1 capsule by mouth daily., Disp: , Rfl:  .  rosuvastatin (CRESTOR) 5 MG tablet, Take 5 mg by mouth daily., Disp: , Rfl:  .  temazepam (RESTORIL) 15 MG capsule, Take 15 mg by mouth at bedtime., Disp: , Rfl:    No Known Allergies   Review of Systems  Constitutional: Negative.   Respiratory: Negative.   Cardiovascular: Negative.   Gastrointestinal: Negative.        He has h/o internal hemorrhoids. Went to GI for eval of fecal smearing. He has undergone banding procedure. His sx seem to have recurred. He has yet to notify GI of this.   Psychiatric/Behavioral: Positive for sleep disturbance.     Today's Vitals   12/29/17 0837  BP: 112/74  Pulse: (!) 53  Temp: 97.6 F (36.4 C)  TempSrc: Oral  Weight: 197 lb 9.6 oz (89.6 kg)  Height: '6\' 2"'$  (1.88 m)   Body mass index is 25.37 kg/m.   Objective:  Physical Exam  Constitutional: He is oriented to person, place,  and time. He appears well-developed and well-nourished.  HENT:  Head: Normocephalic and atraumatic.  Eyes: EOM are normal.  Cardiovascular: Normal rate, regular rhythm and normal heart sounds.  Pulmonary/Chest: Effort normal and breath sounds normal.  Neurological: He is alert and oriented to person, place, and time.  Psychiatric: He has a normal mood and affect.  Nursing note and vitals reviewed.       Assessment And Plan:     1. Pure hypercholesterolemia  I will check a fasting lipid panel and LFTs today. He will continue with current meds. He will rto in six months for his next physical examination.   - Lipid Profile  2. Primary insomnia  Chronic. He has undergone sleep evaluation. Now followed by Dr. Brett Fairy.   3. Complex sleep apnea syndrome  Recent sleep study results reviewed in full detail. Importance of CPAP compliance was discussed with the patient.   4. Internal hemorrhoids with complication  Still has intermittent fecal smearing. Gi evaluation reviewed in full detail during his visit. He does not wish to undergo repeat banding. He will let me know if his sx worsen.    5. Drug therapy  - QuantiFERON-TB Gold Plus - CMP14+EGFR        Curtis Greenland, MD

## 2018-01-01 LAB — LIPID PANEL
Chol/HDL Ratio: 2 ratio (ref 0.0–5.0)
Cholesterol, Total: 159 mg/dL (ref 100–199)
HDL: 78 mg/dL (ref >39)
LDL Calculated: 72 mg/dL (ref 0–99)
Triglycerides: 45 mg/dL (ref 0–149)
VLDL Cholesterol Cal: 9 mg/dL (ref 5–40)

## 2018-01-01 LAB — CMP14+EGFR
ALT: 25 IU/L (ref 0–44)
AST: 21 IU/L (ref 0–40)
Albumin/Globulin Ratio: 2.9 — ABNORMAL HIGH (ref 1.2–2.2)
Albumin: 4.6 g/dL (ref 3.5–5.5)
Alkaline Phosphatase: 74 IU/L (ref 39–117)
BUN/Creatinine Ratio: 13 (ref 9–20)
BUN: 15 mg/dL (ref 6–24)
Bilirubin Total: 0.5 mg/dL (ref 0.0–1.2)
CO2: 25 mmol/L (ref 20–29)
Calcium: 9.4 mg/dL (ref 8.7–10.2)
Chloride: 104 mmol/L (ref 96–106)
Creatinine, Ser: 1.13 mg/dL (ref 0.76–1.27)
GFR calc Af Amer: 90 mL/min/{1.73_m2} (ref >59)
GFR calc non Af Amer: 78 mL/min/{1.73_m2} (ref >59)
Globulin, Total: 1.6 g/dL (ref 1.5–4.5)
Glucose: 89 mg/dL (ref 65–99)
Potassium: 4.6 mmol/L (ref 3.5–5.2)
Sodium: 142 mmol/L (ref 134–144)
Total Protein: 6.2 g/dL (ref 6.0–8.5)

## 2018-01-01 LAB — QUANTIFERON-TB GOLD PLUS
QuantiFERON Mitogen Value: >10 IU/mL
QuantiFERON Nil Value: 0.03 IU/mL
QuantiFERON TB1 Ag Value: 0.03 IU/mL
QuantiFERON TB2 Ag Value: 0.04 IU/mL
QuantiFERON-TB Gold Plus: NEGATIVE

## 2018-01-03 NOTE — Progress Notes (Signed)
Here are your lab results:  Your TB test is negative. Your cholesterol is great. Liver and kidney function are normal.   Happy holidays! Please tell everyone hello!  Sincerely,    Pearlena Ow N. Allyne GeeSanders, MD

## 2018-01-07 DIAGNOSIS — G4733 Obstructive sleep apnea (adult) (pediatric): Secondary | ICD-10-CM | POA: Diagnosis not present

## 2018-01-22 DIAGNOSIS — M9905 Segmental and somatic dysfunction of pelvic region: Secondary | ICD-10-CM | POA: Diagnosis not present

## 2018-01-22 DIAGNOSIS — M9902 Segmental and somatic dysfunction of thoracic region: Secondary | ICD-10-CM | POA: Diagnosis not present

## 2018-01-22 DIAGNOSIS — M9903 Segmental and somatic dysfunction of lumbar region: Secondary | ICD-10-CM | POA: Diagnosis not present

## 2018-01-22 DIAGNOSIS — M9901 Segmental and somatic dysfunction of cervical region: Secondary | ICD-10-CM | POA: Diagnosis not present

## 2018-02-07 DIAGNOSIS — G4733 Obstructive sleep apnea (adult) (pediatric): Secondary | ICD-10-CM | POA: Diagnosis not present

## 2018-02-15 ENCOUNTER — Encounter: Payer: Self-pay | Admitting: Neurology

## 2018-02-18 ENCOUNTER — Ambulatory Visit: Payer: BLUE CROSS/BLUE SHIELD | Admitting: Neurology

## 2018-02-18 ENCOUNTER — Encounter: Payer: Self-pay | Admitting: Neurology

## 2018-02-18 VITALS — BP 110/70 | HR 50 | Ht 73.0 in | Wt 201.0 lb

## 2018-02-18 DIAGNOSIS — G4731 Primary central sleep apnea: Secondary | ICD-10-CM | POA: Diagnosis not present

## 2018-02-18 DIAGNOSIS — R0683 Snoring: Secondary | ICD-10-CM

## 2018-02-18 DIAGNOSIS — F5103 Paradoxical insomnia: Secondary | ICD-10-CM | POA: Diagnosis not present

## 2018-02-18 NOTE — Addendum Note (Signed)
Addended by: Melvyn Novas on: 02/18/2018 09:14 AM   Modules accepted: Orders

## 2018-02-18 NOTE — Progress Notes (Signed)
S[facility]EP [facility]INE CLINIC   Provider:  Melvyn Novas, M D  Primary Care Physician:  Dorothyann Peng, MD   Referring Provider: Dorothyann Peng, MD    Chief Complaint  Patient presents with  . Follow-up    pt alone, rm 11. pt states that CPAP is working well. DME [employer]. states doesn't feel quite as tired as he was during the day.     RV  On 02-18-2018, after 2 sleep studies.  Curtis [name] is a 48 y.o. caucasian, right handed  male patient and was seen in a referral from Dr. Allyne Gee for a sleep medicine consultation.  Curtis [name] underwent a baseline polysomnography with an expanded EEG montage of 29 September 2017 which ended up in a surprising result he had by far dominating central apneas of obstructive apneas.  58 central apneas were seen 10 obstructive 7 2 mixed as well as 44 hypopneas.  The total AHI was 17.5 in the mild-to-moderate range of the RDI was not higher.  During REM sleep there was a less apnea which is a typical distribution for a central apnea patient.  In supine sleep there were 28.5 versus nonsupine 6.0/h.  He also had sinus bradycardia which can be also related to his athletic activities.  Primary snoring was noted and be due to the central apnea nature we asked him to return for an attended CPAP titration which took place on 7 October.  The AHI was reduced to 5.2 overall at a final pressure of 8 cmH2O there was a reduction of 0.0 AHI and 100% sleep efficiency.  The technologist gave the patient a Simplus mask which is a fullface model in large size.  He is now using an auto titration CPAP with a pressure range between 5 and 9 cmH2O and one 1 cm EPR his compliance was 97% by days and 25 of those days were over 4 hours of daily use with an average user time of 6 hours and 8 minutes.  The residual AHI is 5.0 and the residual apneas are still central in nature.  However we manage not to provoke more central apneas.  He feels as if he is still not sleeping super long, but his sleep  has improved, his sleep quality is more restorative. Bradycardia. He trains 4 days a week, cardio and weight lifting.   Epworth changed on CPAP to 2 points and FSS to 13 points.    HPI: Chief complaint according to patient : "Increasingly fatigued, and sleepy. I have wake up early and can't go back to sleep ". He works for [employer] as a Psychologist, counselling. Curtis [name] reports that he has noted certain cycles or phases of sleep changes.  There will be times where he wakes up at the exact time every morning for example 3; 40 minutes AM and after a couple of weeks or so this again will change. It has never been difficult for him to go to sleep in the first place but if he goes to early he also wakes up early.  He craves to be able to sleep 7 or 8 hours but this has rarely happened. He hasn't slept in daytime , has never been a "napper".  Sleep habits are as follows: The patient aims for a bedtime between 10 and 11 PM, if he would go to sleep earlier he would wake up earlier and he always avoids going to sleep later than midnight. He feels pretty good at this routine. Bedroom is cool , quiet and  dark. He shares the bedroom with his wife, but he feels usually better when alone in bed. He sleeps poorly in hotels.  Kids are 8 and 11, no pets in the bedroom. Bathroom break will wake him up some days, mostly around 3 or 4 AM.  He has vivid dreams- but feels these don't wake him, no pain or discomfort that interrupts his sleep. Used to have some low back pain that has resolved.   No enactment, no sleep . Wife reports snoring, mildly and more after alcohol ( weekends ) and only on his back.  He wakes at 6. 30 AM spontaneously. He is always able to wake without alarm. He feels refreshed some days, only after at least 6 hours of sleep. Often he is fatigued.   Sleep medical history and family sleep history: father was a snorer, lost weight- CPAP user. Mother is a Agricultural consultantlight sleeper. One brother who is  younger by 3.5 years sleeps  well.    Social history: he practices good sleep hygiene. He doesn't have electronics in the bedroom- married, 2 children in school age. Sales rep , travels by car.  Non smoker, exerciser, ETOH- wine or beer on weekends, 2-3 beers per WE evening, caffeine - no soda, 2 cups of coffee in AM, sweet tea at lunch.     Review of Systems: Out of a complete 14 system review, the patient complains of only the following symptoms, and all other reviewed systems are negative. Snoring, early morning waking up- 10 out of 14 days.   Epworth score 2/ 24  , Fatigue severity score 23/ 63 , depression score n/a    Social History   Socioeconomic History  . Marital status: Married    Spouse name: Not on file  . Number of children: Not on file  . Years of education: Not on file  . Highest education level: Not on file  Occupational History  . Not on file  Social Needs  . Financial resource strain: Not on file  . Food insecurity:    Worry: Not on file    Inability: Not on file  . Transportation needs:    Medical: Not on file    Non-medical: Not on file  Tobacco Use  . Smoking status: Never Smoker  . Smokeless tobacco: Never Used  Substance and Sexual Activity  . Alcohol use: Not on file  . Drug use: Never  . Sexual activity: Not on file  Lifestyle  . Physical activity:    Days per week: Not on file    Minutes per session: Not on file  . Stress: Not on file  Relationships  . Social connections:    Talks on phone: Not on file    Gets together: Not on file    Attends religious service: Not on file    Active member of club or organization: Not on file    Attends meetings of clubs or organizations: Not on file    Relationship status: Not on file  . Intimate partner violence:    Fear of current or ex partner: Not on file    Emotionally abused: Not on file    Physically abused: Not on file    Forced sexual activity: Not on file  Other Topics Concern  . Not on file  Social History Narrative    . Not on file    Family History  Problem Relation Age of Onset  . Hypertension Father   . High Cholesterol Father  Past Medical History:  Diagnosis Date  . Acne   . Allergic rhinitis   . GERD (gastroesophageal reflux disease)   . Insomnia   . Olecranon bursitis   . Pure hypercholesterolemia   . Vitamin D deficiency     Past Surgical History:  Procedure Laterality Date  . INGUINAL HERNIA REPAIR Right   . WISDOM TOOTH EXTRACTION      Current Outpatient Medications  Medication Sig Dispense Refill  . Cholecalciferol (VITAMIN D) 2000 units CAPS Take 1 capsule by mouth daily.    . Omega-3 Fatty Acids (FISH OIL) 500 MG CAPS Take 1 capsule by mouth daily.    . rosuvastatin (CRESTOR) 5 MG tablet Take 5 mg by mouth daily.    . temazepam (RESTORIL) 15 MG capsule Take 15 mg by mouth at bedtime.     No current facility-administered medications for this visit.     Allergies as of 02/18/2018  . (No Known Allergies)    Vitals: BP 110/70   Pulse (!) 50   Ht 6\' 1"  (1.854 m)   Wt 201 lb (91.2 kg)   BMI 26.52 kg/m  Last Weight:  Wt Readings from Last 1 Encounters:  02/18/18 201 lb (91.2 kg)   UJW:JXBJBMI:Body mass index is 26.52 kg/m.     Last Height:   Ht Readings from Last 1 Encounters:  02/18/18 6\' 1"  (1.854 m)    Physical exam:  General: The patient is awake, alert and appears not in acute distress. The patient is well groomed. Head: Normocephalic, atraumatic. Neck is supple. Mallampati 3,  neck circumference:16" Nasal airflow patent ,  no Retrognathia is seen.  Cardiovascular:  Regular rate and rhythm , without  murmurs or carotid bruit, and without distended neck veins. Respiratory: Lungs are clear to auscultation. Skin:  Without evidence of edema, or rash Trunk: BMI is 25 kg/m2. The patient's posture is erect. Neurologic exam : The patient is awake and alert, oriented to place and time.   Memory subjective  described as intact.  Attention span & concentration  ability appears normal.  Speech is fluent.  Cranial nerves: Pupils are equal and briskly reactive to light. Funduscopic exam without  evidence of pallor or edema. Extraocular movements  in vertical and horizontal planes intact and without nystagmus. Visual fields by finger perimetry are intact.Hearing to finger rub intact.  Facial sensation intact to fine touch. Facial motor strength is symmetric and tongue and uvula move midline. Shoulder shrug was symmetrical.   Motor exam:   Normal tone, muscle bulk and symmetric strength in all extremities.Sensory:  Fine touch, pinprick and vibration were tested in all extremities. Coordination: normal without evidence of ataxia, dysmetria or tremor. Gait and station: Patient walks without assistive device. Deep tendon reflexes:  intact.   Assessment:  After physical and neurologic examination, review of laboratory studies,  Personal review of imaging studies, reports of other /same  Imaging studies, results of polysomnography and / or neurophysiology testing and pre-existing records as far as provided in visit., my assessment is   1)  Central apnea with bradycardia.   2) good response to auto- CPAP, will increase pressure by 1 cm and EPR by 1 cm.   The patient was advised of the nature of the diagnosed disorder , the treatment options and the  risks for general health and wellness arising from not treating the condition.   I spent more than 15 minutes of face to face time with the patient.  Greater than 50% of time  was spent in counseling and coordination of care. We have discussed the diagnosis and differential and I answered the patient's questions.    Rv yearly - 15 minute for compliance.    Melvyn Novas, MD 02/18/2018, 8:51 AM  Certified in Neurology by ABPN Certified in Sleep Medicine by [facility] [address], [address] [place], [place] [address]

## 2018-02-24 DIAGNOSIS — D1801 Hemangioma of skin and subcutaneous tissue: Secondary | ICD-10-CM | POA: Diagnosis not present

## 2018-02-24 DIAGNOSIS — L700 Acne vulgaris: Secondary | ICD-10-CM | POA: Diagnosis not present

## 2018-03-10 DIAGNOSIS — G4733 Obstructive sleep apnea (adult) (pediatric): Secondary | ICD-10-CM | POA: Diagnosis not present

## 2018-03-21 ENCOUNTER — Other Ambulatory Visit: Payer: Self-pay | Admitting: Internal Medicine

## 2018-03-21 MED ORDER — TEMAZEPAM 15 MG PO CAPS: 15.0000 mg | ORAL_CAPSULE | Freq: Every evening | ORAL | 2 refills | Status: DC | PRN

## 2018-03-26 DIAGNOSIS — M9902 Segmental and somatic dysfunction of thoracic region: Secondary | ICD-10-CM | POA: Diagnosis not present

## 2018-03-26 DIAGNOSIS — M9905 Segmental and somatic dysfunction of pelvic region: Secondary | ICD-10-CM | POA: Diagnosis not present

## 2018-03-26 DIAGNOSIS — M9903 Segmental and somatic dysfunction of lumbar region: Secondary | ICD-10-CM | POA: Diagnosis not present

## 2018-03-26 DIAGNOSIS — M9901 Segmental and somatic dysfunction of cervical region: Secondary | ICD-10-CM | POA: Diagnosis not present

## 2018-04-08 DIAGNOSIS — G4733 Obstructive sleep apnea (adult) (pediatric): Secondary | ICD-10-CM | POA: Diagnosis not present

## 2018-05-09 DIAGNOSIS — G4733 Obstructive sleep apnea (adult) (pediatric): Secondary | ICD-10-CM | POA: Diagnosis not present

## 2018-06-08 DIAGNOSIS — G4733 Obstructive sleep apnea (adult) (pediatric): Secondary | ICD-10-CM | POA: Diagnosis not present

## 2018-06-30 ENCOUNTER — Encounter: Payer: Self-pay | Admitting: Internal Medicine

## 2018-07-02 ENCOUNTER — Encounter: Payer: BLUE CROSS/BLUE SHIELD | Admitting: Internal Medicine

## 2018-07-08 ENCOUNTER — Telehealth: Payer: Self-pay | Admitting: Internal Medicine

## 2018-07-08 ENCOUNTER — Ambulatory Visit: Payer: BLUE CROSS/BLUE SHIELD | Admitting: Internal Medicine

## 2018-07-08 NOTE — Telephone Encounter (Signed)
Patient has agreed to a virtual visit with Dr.Sanders on 07/09/18 °

## 2018-07-09 ENCOUNTER — Other Ambulatory Visit: Payer: Self-pay

## 2018-07-09 ENCOUNTER — Ambulatory Visit: Payer: BLUE CROSS/BLUE SHIELD | Admitting: Internal Medicine

## 2018-07-09 ENCOUNTER — Encounter: Payer: Self-pay | Admitting: Internal Medicine

## 2018-07-09 ENCOUNTER — Encounter: Payer: BLUE CROSS/BLUE SHIELD | Admitting: Internal Medicine

## 2018-07-09 DIAGNOSIS — G4733 Obstructive sleep apnea (adult) (pediatric): Secondary | ICD-10-CM | POA: Diagnosis not present

## 2018-07-13 ENCOUNTER — Ambulatory Visit: Payer: BLUE CROSS/BLUE SHIELD | Admitting: Internal Medicine

## 2018-07-14 ENCOUNTER — Encounter: Payer: Self-pay | Admitting: Internal Medicine

## 2018-07-14 ENCOUNTER — Other Ambulatory Visit: Payer: Self-pay

## 2018-07-14 ENCOUNTER — Ambulatory Visit (INDEPENDENT_AMBULATORY_CARE_PROVIDER_SITE_OTHER): Payer: BLUE CROSS/BLUE SHIELD | Admitting: Internal Medicine

## 2018-07-14 VITALS — Temp 98.1°F | Ht 73.0 in

## 2018-07-14 DIAGNOSIS — F5101 Primary insomnia: Secondary | ICD-10-CM

## 2018-07-14 DIAGNOSIS — G8929 Other chronic pain: Secondary | ICD-10-CM | POA: Diagnosis not present

## 2018-07-14 DIAGNOSIS — M545 Low back pain, unspecified: Secondary | ICD-10-CM

## 2018-07-14 MED ORDER — CYCLOBENZAPRINE HCL ER 15 MG PO CP24: 15.0000 mg | ORAL_CAPSULE | Freq: Every day | ORAL | 0 refills | Status: DC | PRN

## 2018-07-14 MED ORDER — ROSUVASTATIN CALCIUM 5 MG PO TABS: 5.0000 mg | ORAL_TABLET | Freq: Every day | ORAL | 1 refills | Status: DC

## 2018-07-14 MED ORDER — TEMAZEPAM 15 MG PO CAPS: 15.0000 mg | ORAL_CAPSULE | Freq: Every evening | ORAL | 2 refills | Status: DC | PRN

## 2018-07-14 NOTE — Patient Instructions (Signed)

## 2018-07-20 ENCOUNTER — Encounter: Payer: BLUE CROSS/BLUE SHIELD | Admitting: Internal Medicine

## 2018-08-08 DIAGNOSIS — G4733 Obstructive sleep apnea (adult) (pediatric): Secondary | ICD-10-CM | POA: Diagnosis not present

## 2018-08-09 NOTE — Progress Notes (Signed)
Virtual Visit via Video   This visit type was conducted due to national recommendations for restrictions regarding the COVID-19 Pandemic (e.g. social distancing) in an effort to limit this patient's exposure and mitigate transmission in our community.  Due to his co-morbid illnesses, this patient is at least at moderate risk for complications without adequate follow up.  This format is felt to be most appropriate for this patient at this time.  All issues noted in this document were discussed and addressed.  A limited physical exam was performed with this format.    This visit type was conducted due to national recommendations for restrictions regarding the COVID-19 Pandemic (e.g. social distancing) in an effort to limit this patient's exposure and mitigate transmission in our community.  Patients identity confirmed using two different identifiers.  This format is felt to be most appropriate for this patient at this time.  All issues noted in this document were discussed and addressed.  No physical exam was performed (except for noted visual exam findings with Video Visits).    Date:  08/09/2018   ID:  Curtis [name], DOB March 18, 1970, MRN [id number]  Patient Location:  Home  Provider location:   Office    Chief Complaint:  F/U insomnia  History of Present Illness:    Curtis [name] is a 48 y.o. male who presents via video conferencing for a telehealth visit today.    The patient does not have symptoms concerning for COVID-19 infection (fever, chills, cough, or new shortness of breath).   He presents today for virtual visit. He prefers this method of contact due to COVID-19 pandemic.  He has not had any known exposure to COVID-19. He presents today for f/u insomnia. He needs refill of temazepam.      Past Medical History:  Diagnosis Date  . Acne   . Allergic rhinitis   . GERD (gastroesophageal reflux disease)   . Insomnia   . Olecranon bursitis   . Pure hypercholesterolemia   . Vitamin  D deficiency    Past Surgical History:  Procedure Laterality Date  . INGUINAL HERNIA REPAIR Right   . WISDOM TOOTH EXTRACTION       Current Meds  Medication Sig  . Cholecalciferol (VITAMIN D) 2000 units CAPS Take 1 capsule by mouth daily.  . Omega-3 Fatty Acids (FISH OIL) 500 MG CAPS Take 1 capsule by mouth daily.  . rosuvastatin (CRESTOR) 5 MG tablet Take 1 tablet (5 mg total) by mouth daily.  . temazepam (RESTORIL) 15 MG capsule Take 1 capsule (15 mg total) by mouth at bedtime as needed for sleep.  . [DISCONTINUED] rosuvastatin (CRESTOR) 5 MG tablet Take 5 mg by mouth daily.  . [DISCONTINUED] temazepam (RESTORIL) 15 MG capsule Take 1 capsule (15 mg total) by mouth at bedtime as needed for sleep.     Allergies:   Patient has no known allergies.   Social History   Tobacco Use  . Smoking status: Never Smoker  . Smokeless tobacco: Never Used  Substance Use Topics  . Alcohol use: Yes    Alcohol/week: 6.0 standard drinks    Types: 6 Glasses of wine per week  . Drug use: Never     Family Hx: The patient's family history includes Healthy in his brother and mother; High Cholesterol in his father; Hypertension in his father.  ROS:   Please see the history of present illness.    Review of Systems  Constitutional: Negative.   Respiratory: Negative.   Cardiovascular: Negative.  Gastrointestinal: Negative.   Musculoskeletal: Positive for back pain.  Neurological: Negative.   Psychiatric/Behavioral: Negative.     All other systems reviewed and are negative.   Labs/Other Tests and Data Reviewed:    Recent Labs: 12/29/2017: ALT 25; BUN 15; Creatinine, Ser 1.13; Potassium 4.6; Sodium 142   Recent Lipid Panel Lab Results  Component Value Date/Time   CHOL 159 12/29/2017 09:06 AM   TRIG 45 12/29/2017 09:06 AM   HDL 78 12/29/2017 09:06 AM   CHOLHDL 2.0 12/29/2017 09:06 AM   LDLCALC 72 12/29/2017 09:06 AM    Wt Readings from Last 3 Encounters:  02/18/18 201 lb (91.2 kg)   12/29/17 197 lb 9.6 oz (89.6 kg)  09/09/17 194 lb (88 kg)     Exam:    Vital Signs:  Temp 98.1 F (36.7 C) (Oral) Comment: pt provided  Ht 6\' 1"  (1.854 m)   BMI 26.52 kg/m     Physical Exam  Constitutional: He is oriented to person, place, and time and well-developed, well-nourished, and in no distress.  HENT:  Head: Normocephalic and atraumatic.  Neck: Normal range of motion.  Pulmonary/Chest: Effort normal.  Neurological: He is alert and oriented to person, place, and time.  Psychiatric: Affect normal.  Nursing note and vitals reviewed.   ASSESSMENT & PLAN:     1. Primary insomnia  Chronic. He was given refill of temazepam to use nightly as needed. Importance of developing bedtime routine and having good bedtime hygiene was discussed with the patient.   2. Chronic low back pain without sciatica, unspecified back pain laterality  He was given rx cyclobenzaprine to use nightly prn. He is encouraged to perform stretches daily. He will let me know if his sx persist.   COVID-19 Education: The signs and symptoms of COVID-19 were discussed with the patient and how to seek care for testing (follow up with PCP or arrange E-visit).  The importance of social distancing was discussed today.  Patient Risk:   After full review of this patients clinical status, I feel that they are at least moderate risk at this time.  Time:   Today, I have spent 8 minutes/ 10 seconds with the patient with telehealth technology discussing above diagnoses.     Medication Adjustments/Labs and Tests Ordered: Current medicines are reviewed at length with the patient today.  Concerns regarding medicines are outlined above.   Tests Ordered: No orders of the defined types were placed in this encounter.   Medication Changes: Meds ordered this encounter  Medications  . temazepam (RESTORIL) 15 MG capsule    Sig: Take 1 capsule (15 mg total) by mouth at bedtime as needed for sleep.    Dispense:  60  capsule    Refill:  2  . rosuvastatin (CRESTOR) 5 MG tablet    Sig: Take 1 tablet (5 mg total) by mouth daily.    Dispense:  90 tablet    Refill:  1  . cyclobenzaprine (AMRIX) 15 MG 24 hr capsule    Sig: Take 1 capsule (15 mg total) by mouth daily as needed for muscle spasms.    Dispense:  30 capsule    Refill:  0    Disposition:  Follow up in 6 month(s)  Signed, Gwynneth Alimentobyn N Sadao Weyer, MD

## 2018-08-11 DIAGNOSIS — M9903 Segmental and somatic dysfunction of lumbar region: Secondary | ICD-10-CM | POA: Diagnosis not present

## 2018-08-11 DIAGNOSIS — M9905 Segmental and somatic dysfunction of pelvic region: Secondary | ICD-10-CM | POA: Diagnosis not present

## 2018-08-11 DIAGNOSIS — M9902 Segmental and somatic dysfunction of thoracic region: Secondary | ICD-10-CM | POA: Diagnosis not present

## 2018-08-11 DIAGNOSIS — M9901 Segmental and somatic dysfunction of cervical region: Secondary | ICD-10-CM | POA: Diagnosis not present

## 2018-08-27 DIAGNOSIS — L700 Acne vulgaris: Secondary | ICD-10-CM | POA: Diagnosis not present

## 2018-09-07 ENCOUNTER — Encounter: Payer: Self-pay | Admitting: Internal Medicine

## 2018-09-07 ENCOUNTER — Ambulatory Visit (INDEPENDENT_AMBULATORY_CARE_PROVIDER_SITE_OTHER): Payer: BC Managed Care – PPO | Admitting: Internal Medicine

## 2018-09-07 ENCOUNTER — Other Ambulatory Visit: Payer: Self-pay

## 2018-09-07 VITALS — BP 114/66 | HR 55 | Temp 98.4°F | Ht 73.0 in | Wt 192.6 lb

## 2018-09-07 DIAGNOSIS — E785 Hyperlipidemia, unspecified: Secondary | ICD-10-CM | POA: Diagnosis not present

## 2018-09-07 DIAGNOSIS — Z0000 Encounter for general adult medical examination without abnormal findings: Secondary | ICD-10-CM

## 2018-09-07 DIAGNOSIS — Z23 Encounter for immunization: Secondary | ICD-10-CM | POA: Diagnosis not present

## 2018-09-07 DIAGNOSIS — Z79899 Other long term (current) drug therapy: Secondary | ICD-10-CM | POA: Diagnosis not present

## 2018-09-07 MED ORDER — DAYVIGO 5 MG PO TABS: 5.0000 mg | ORAL_TABLET | Freq: Every evening | ORAL | 0 refills | Status: DC | PRN

## 2018-09-07 MED ORDER — CYCLOBENZAPRINE HCL ER 15 MG PO CP24: 15.0000 mg | ORAL_CAPSULE | Freq: Every day | ORAL | 0 refills | Status: DC | PRN

## 2018-09-07 NOTE — Progress Notes (Signed)
Subjective:     Patient ID: Curtis [name] , male    DOB: 05/23/1970 , 48 y.o.   MRN: [id number]   Chief Complaint  Patient presents with  . Annual Exam    HPI  He is here today for a full physical examination.  He has no specific concerns or complaints.     Past Medical History:  Diagnosis Date  . Acne   . Allergic rhinitis   . GERD (gastroesophageal reflux disease)   . Insomnia   . Olecranon bursitis   . Pure hypercholesterolemia   . Vitamin D deficiency      Family History  Problem Relation Age of Onset  . Healthy Mother   . Hypertension Father   . High Cholesterol Father   . Healthy Brother      Current Outpatient Medications:  .  Cholecalciferol (VITAMIN D) 2000 units CAPS, Take 1 capsule by mouth daily., Disp: , Rfl:  .  cyclobenzaprine (AMRIX) 15 MG 24 hr capsule, Take 1 capsule (15 mg total) by mouth daily as needed for muscle spasms., Disp: 30 capsule, Rfl: 0 .  ISOtretinoin (ACCUTANE PO), Take by mouth., Disp: , Rfl:  .  Omega-3 Fatty Acids (FISH OIL) 500 MG CAPS, Take 1 capsule by mouth daily., Disp: , Rfl:  .  rosuvastatin (CRESTOR) 5 MG tablet, Take 1 tablet (5 mg total) by mouth daily., Disp: 90 tablet, Rfl: 1 .  temazepam (RESTORIL) 15 MG capsule, Take 1 capsule (15 mg total) by mouth at bedtime as needed for sleep., Disp: 60 capsule, Rfl: 2   No Known Allergies   Men's preventive visit. Patient Health Questionnaire (PHQ-2) is    Office Visit from 07/14/2018 in [facility]  PHQ-2 Total Score  0    . Patient is on a healthy diet. Marital status: Married. Relevant history for alcohol use is:  Social History   Substance and Sexual Activity  Alcohol Use Yes  . Alcohol/week: 6.0 standard drinks  . Types: 6 Glasses of wine per week  . Relevant history for tobacco use is:  Social History   Tobacco Use  Smoking Status Never Smoker  Smokeless Tobacco Never Used    Review of Systems  Constitutional: Negative.   HENT: Negative.    Eyes: Negative.   Respiratory: Negative.   Cardiovascular: Negative.   Endocrine: Negative.   Genitourinary: Negative.   Musculoskeletal: Negative.   Skin: Negative.   Allergic/Immunologic: Negative.   Neurological: Negative.   Hematological: Negative.   Psychiatric/Behavioral: Negative.      Today's Vitals   09/07/18 1153  BP: 114/66  Pulse: (!) 55  Temp: 98.4 F (36.9 C)  TempSrc: Oral  Weight: 192 lb 9.6 oz (87.4 kg)  Height: _0  (1.854 m)   Body mass index is 25.41 kg/m.   Objective:  Physical Exam Vitals signs and nursing note reviewed.  Constitutional:      Appearance: Normal appearance.  HENT:     Head: Normocephalic and atraumatic.     Right Ear: Tympanic membrane, ear canal and external ear normal.     Left Ear: Tympanic membrane, ear canal and external ear normal.     Nose: Nose normal.     Mouth/Throat:     Mouth: Mucous membranes are moist.     Pharynx: Oropharynx is clear.  Eyes:     Extraocular Movements: Extraocular movements intact.     Conjunctiva/sclera: Conjunctivae normal.     Pupils: Pupils are equal, round, and reactive to  light.  Neck:     Musculoskeletal: Normal range of motion and neck supple.  Cardiovascular:     Rate and Rhythm: Normal rate and regular rhythm.     Pulses: Normal pulses.     Heart sounds: Normal heart sounds.  Pulmonary:     Effort: Pulmonary effort is normal.     Breath sounds: Normal breath sounds.  Chest:     Breasts:        Right: Normal. No swelling, bleeding, inverted nipple, mass or nipple discharge.        Left: Normal. No swelling, bleeding, inverted nipple, mass or nipple discharge.  Abdominal:     General: Abdomen is flat. Bowel sounds are normal.     Palpations: Abdomen is soft.  Genitourinary:    Comments: deferred Musculoskeletal: Normal range of motion.  Skin:    General: Skin is warm.  Neurological:     General: No focal deficit present.     Mental Status: He is alert.  Psychiatric:         Mood and Affect: Mood normal.        Behavior: Behavior normal.         Assessment And Plan:     1. Routine general medical examination at health care facility  A full exam was performed.  PATIENT HAS BEEN ADVISED TO GET 30-45 MINUTES REGULAR EXERCISE NO LESS THAN FOUR TO FIVE DAYS PER WEEK - BOTH WEIGHTBEARING EXERCISES AND AEROBIC ARE RECOMMENDED.  HE IS ADVISED TO FOLLOW A HEALTHY DIET WITH AT LEAST SIX FRUITS/VEGGIES PER DAY, DECREASE INTAKE OF RED MEAT, AND TO INCREASE FISH INTAKE TO TWO DAYS PER WEEK.  MEATS/FISH SHOULD NOT BE FRIED, BAKED OR BROILED IS PREFERABLE.  I SUGGEST WEARING SPF 50 SUNSCREEN ON EXPOSED PARTS AND ESPECIALLY WHEN IN THE DIRECT SUNLIGHT FOR AN EXTENDED PERIOD OF TIME.  PLEASE AVOID FAST FOOD RESTAURANTS AND INCREASE YOUR WATER INTAKE.  - CMP14+EGFR - CBC - Lipid panel - Hemoglobin A1c - QuantiFERON-TB Gold Plus    2. Encounter for immunization  He was given Tdap to update his immunization history.   - Tdap vaccine greater than or equal to 7yo IM       Maximino Greenland, MD    THE PATIENT IS ENCOURAGED TO PRACTICE SOCIAL DISTANCING DUE TO THE COVID-19 PANDEMIC.

## 2018-09-07 NOTE — Patient Instructions (Signed)

## 2018-09-08 ENCOUNTER — Encounter: Payer: Self-pay | Admitting: Internal Medicine

## 2018-09-08 DIAGNOSIS — G4733 Obstructive sleep apnea (adult) (pediatric): Secondary | ICD-10-CM | POA: Diagnosis not present

## 2018-09-09 LAB — CBC
Hematocrit: 48 % (ref 37.5–51.0)
Hemoglobin: 16 g/dL (ref 13.0–17.7)
MCH: 30.5 pg (ref 26.6–33.0)
MCHC: 33.3 g/dL (ref 31.5–35.7)
MCV: 92 fL (ref 79–97)
Platelets: 207 10*3/uL (ref 150–450)
RBC: 5.24 x10E6/uL (ref 4.14–5.80)
RDW: 12.8 % (ref 11.6–15.4)
WBC: 4 10*3/uL (ref 3.4–10.8)

## 2018-09-09 LAB — LIPID PANEL
Chol/HDL Ratio: 2.3 ratio (ref 0.0–5.0)
Cholesterol, Total: 157 mg/dL (ref 100–199)
HDL: 68 mg/dL (ref >39)
LDL Calculated: 79 mg/dL (ref 0–99)
Triglycerides: 52 mg/dL (ref 0–149)
VLDL Cholesterol Cal: 10 mg/dL (ref 5–40)

## 2018-09-09 LAB — CMP14+EGFR
ALT: 41 IU/L (ref 0–44)
AST: 40 IU/L (ref 0–40)
Albumin/Globulin Ratio: 2.6 — ABNORMAL HIGH (ref 1.2–2.2)
Albumin: 4.9 g/dL (ref 4.0–5.0)
Alkaline Phosphatase: 75 IU/L (ref 39–117)
BUN/Creatinine Ratio: 12 (ref 9–20)
BUN: 15 mg/dL (ref 6–24)
Bilirubin Total: 0.6 mg/dL (ref 0.0–1.2)
CO2: 26 mmol/L (ref 20–29)
Calcium: 9.5 mg/dL (ref 8.7–10.2)
Chloride: 104 mmol/L (ref 96–106)
Creatinine, Ser: 1.26 mg/dL (ref 0.76–1.27)
GFR calc Af Amer: 79 mL/min/{1.73_m2} (ref >59)
GFR calc non Af Amer: 68 mL/min/{1.73_m2} (ref >59)
Globulin, Total: 1.9 g/dL (ref 1.5–4.5)
Glucose: 99 mg/dL (ref 65–99)
Potassium: 4.4 mmol/L (ref 3.5–5.2)
Sodium: 144 mmol/L (ref 134–144)
Total Protein: 6.8 g/dL (ref 6.0–8.5)

## 2018-09-09 LAB — QUANTIFERON-TB GOLD PLUS
QuantiFERON Mitogen Value: 9.01 IU/mL
QuantiFERON Nil Value: 0.02 IU/mL
QuantiFERON TB1 Ag Value: 0.02 IU/mL
QuantiFERON TB2 Ag Value: 0.02 IU/mL
QuantiFERON-TB Gold Plus: NEGATIVE

## 2018-09-09 LAB — HEMOGLOBIN A1C
Est. average glucose Bld gHb Est-mCnc: 100 mg/dL
Hgb A1c MFr Bld: 5.1 % (ref 4.8–5.6)

## 2018-09-10 DIAGNOSIS — M9905 Segmental and somatic dysfunction of pelvic region: Secondary | ICD-10-CM | POA: Diagnosis not present

## 2018-09-10 DIAGNOSIS — M9902 Segmental and somatic dysfunction of thoracic region: Secondary | ICD-10-CM | POA: Diagnosis not present

## 2018-09-10 DIAGNOSIS — M9901 Segmental and somatic dysfunction of cervical region: Secondary | ICD-10-CM | POA: Diagnosis not present

## 2018-09-10 DIAGNOSIS — M9903 Segmental and somatic dysfunction of lumbar region: Secondary | ICD-10-CM | POA: Diagnosis not present

## 2018-09-15 ENCOUNTER — Encounter: Payer: BLUE CROSS/BLUE SHIELD | Admitting: Internal Medicine

## 2018-09-16 ENCOUNTER — Encounter: Payer: BLUE CROSS/BLUE SHIELD | Admitting: Internal Medicine

## 2018-10-09 DIAGNOSIS — G4733 Obstructive sleep apnea (adult) (pediatric): Secondary | ICD-10-CM | POA: Diagnosis not present

## 2018-10-22 DIAGNOSIS — M9901 Segmental and somatic dysfunction of cervical region: Secondary | ICD-10-CM | POA: Diagnosis not present

## 2018-10-22 DIAGNOSIS — M9902 Segmental and somatic dysfunction of thoracic region: Secondary | ICD-10-CM | POA: Diagnosis not present

## 2018-10-22 DIAGNOSIS — M9903 Segmental and somatic dysfunction of lumbar region: Secondary | ICD-10-CM | POA: Diagnosis not present

## 2018-10-22 DIAGNOSIS — M9905 Segmental and somatic dysfunction of pelvic region: Secondary | ICD-10-CM | POA: Diagnosis not present

## 2018-10-24 ENCOUNTER — Encounter: Payer: Self-pay | Admitting: Internal Medicine

## 2018-10-26 ENCOUNTER — Other Ambulatory Visit: Payer: Self-pay

## 2018-10-26 MED ORDER — DAYVIGO 5 MG PO TABS: 5.0000 mg | ORAL_TABLET | Freq: Every evening | ORAL | 0 refills | Status: DC | PRN

## 2018-10-28 DIAGNOSIS — L700 Acne vulgaris: Secondary | ICD-10-CM | POA: Diagnosis not present

## 2018-11-09 DIAGNOSIS — Z23 Encounter for immunization: Secondary | ICD-10-CM | POA: Diagnosis not present

## 2018-11-26 DIAGNOSIS — M9902 Segmental and somatic dysfunction of thoracic region: Secondary | ICD-10-CM | POA: Diagnosis not present

## 2018-11-26 DIAGNOSIS — M9901 Segmental and somatic dysfunction of cervical region: Secondary | ICD-10-CM | POA: Diagnosis not present

## 2018-11-26 DIAGNOSIS — M9903 Segmental and somatic dysfunction of lumbar region: Secondary | ICD-10-CM | POA: Diagnosis not present

## 2018-11-26 DIAGNOSIS — M9905 Segmental and somatic dysfunction of pelvic region: Secondary | ICD-10-CM | POA: Diagnosis not present

## 2018-12-11 ENCOUNTER — Other Ambulatory Visit: Payer: Self-pay | Admitting: Internal Medicine

## 2018-12-17 DIAGNOSIS — G4733 Obstructive sleep apnea (adult) (pediatric): Secondary | ICD-10-CM | POA: Diagnosis not present

## 2018-12-22 DIAGNOSIS — M9902 Segmental and somatic dysfunction of thoracic region: Secondary | ICD-10-CM | POA: Diagnosis not present

## 2018-12-22 DIAGNOSIS — M9905 Segmental and somatic dysfunction of pelvic region: Secondary | ICD-10-CM | POA: Diagnosis not present

## 2018-12-22 DIAGNOSIS — M9903 Segmental and somatic dysfunction of lumbar region: Secondary | ICD-10-CM | POA: Diagnosis not present

## 2018-12-22 DIAGNOSIS — M9901 Segmental and somatic dysfunction of cervical region: Secondary | ICD-10-CM | POA: Diagnosis not present

## 2019-02-22 ENCOUNTER — Encounter: Payer: Self-pay | Admitting: Neurology

## 2019-02-22 ENCOUNTER — Ambulatory Visit (INDEPENDENT_AMBULATORY_CARE_PROVIDER_SITE_OTHER): Payer: Managed Care, Other (non HMO) | Admitting: Neurology

## 2019-02-22 ENCOUNTER — Other Ambulatory Visit: Payer: Self-pay

## 2019-02-22 VITALS — BP 117/74 | HR 58 | Temp 96.0°F | Ht 73.0 in | Wt 198.0 lb

## 2019-02-22 DIAGNOSIS — F5103 Paradoxical insomnia: Secondary | ICD-10-CM | POA: Diagnosis not present

## 2019-02-22 DIAGNOSIS — G4731 Primary central sleep apnea: Secondary | ICD-10-CM | POA: Diagnosis not present

## 2019-02-22 MED ORDER — TRAZODONE HCL 50 MG PO TABS: 50.0000 mg | ORAL_TABLET | Freq: Every evening | ORAL | 11 refills | Status: DC | PRN

## 2019-02-22 NOTE — Progress Notes (Signed)
SLEEP MEDICINE CLINIC   Provider:  Larey [name], Curtis [name]  Primary Care Physician:  Glendale Chard, MD   Referring Provider: Glendale Chard, MD    Chief Complaint  Patient presents with  . Follow-up    pt alone, rm 11. pt states things are about the same. he states he has difficulty with sleep still. DME [employer]    RV 02-22-2019, I have the pleasure of meeting with Curtis [name] today, an established complex sleep apnea patient ,who also has insomnia. While his primary central sleep apnea has been controlled he still has difficulties to  sleep through the night. His compliance on his air sense 10 machine is excellent he uses a very small pressure window between 5 and 9 cmH2O this 2 cm expiratory pressure relief, average use at time 6 hours 25 minutes and he has a 93% compliance by days.  His residual AHI is 2.3 and the residual apneas are mostly central at 1.5/h however this is a good resolution of apnea.  She endorsed the fatigue severity scale 24 points out of 63.  He endorsed the Epworth sleepiness score at only 4 out of 24 points. He goes to sleep around 10 10:30 PM but he has problems with waking up early at around 4 AM but he does not need to get up and it is not desired to get up.  However there are also days and weeks where he can catch 7 hours of sleep alternating with days and nights of 5 or 5-1/2 hours of sleep.  The early morning awakening may be related to low serotonin levels.  I will be happy to discuss cyclic sleep disorders here today.    02-18-2018,  RV after 2 sleep studies.  Curtis [name] is a 49 y.o. caucasian, right handed  male patient and was seen in a referral from Dr. Baird Cancer for a sleep medicine consultation.  Curtis [name] underwent a baseline polysomnography with an expanded EEG montage of 29 September 2017 which ended up in a surprising result he had by far dominating central apneas of obstructive apneas.  58 central apneas were seen 10 obstructive 7 2 mixed as well as  44 hypopneas.  The total AHI was 17.5 in the mild-to-moderate range of the RDI was not higher.  During REM sleep there was a less apnea which is a typical distribution for a central apnea patient.  In supine sleep there were 28.5 versus nonsupine 6.0/h.  He also had sinus bradycardia which can be also related to his athletic activities.  Primary snoring was noted and be due to the central apnea nature we asked him to return for an attended CPAP titration which took place on 7 October.  The AHI was reduced to 5.2 overall at a final pressure of 8 cmH2O there was a reduction of 0.0 AHI and 100% sleep efficiency.  The technologist gave the patient a Simplus mask which is a fullface model in large size.  He is now using an auto titration CPAP with a pressure range between 5 and 9 cmH2O and one 1 cm EPR his compliance was 97% by days and 25 of those days were over 4 hours of daily use with an average user time of 6 hours and 8 minutes.  The residual AHI is 5.0 and the residual apneas are still central in nature.  However we manage not to provoke more central apneas.  He feels as if he is still not sleeping super long, but his sleep has  improved, his sleep quality is more restorative. Bradycardia. He trains 4 days a week, cardio and weight lifting.   Epworth changed on CPAP to 2 points and FSS to 13 points.    HPI: Chief complaint according to patient : "Increasingly fatigued, and sleepy. I have wake up early and can't go back to sleep ". He works for [name] as a Psychologist, counselling. Curtis [name] reports that he has noted certain cycles or phases of sleep changes.  There will be times where he wakes up at the exact time every morning for example 3; 40 minutes AM and after a couple of weeks or so this again will change. It has never been difficult for him to go to sleep in the first place but if he goes to early he also wakes up early.  He craves to be able to sleep 7 or 8 hours but this has rarely happened. He hasn't slept in  daytime , has never been a "napper".  Sleep habits are as follows: The patient aims for a bedtime between 10 and 11 PM, if he would go to sleep earlier he would wake up earlier and he always avoids going to sleep later than midnight. He feels pretty good at this routine. Bedroom is cool , quiet and dark. He shares the bedroom with his wife, but he feels usually better when alone in bed. He sleeps poorly in hotels.  Kids are 8 and 11, no pets in the bedroom. Bathroom break will wake him up some days, mostly around 3 or 4 AM.  He has vivid dreams- but feels these don't wake him, no pain or discomfort that interrupts his sleep. Used to have some low back pain that has resolved.   No enactment, no sleep . Wife reports snoring, mildly and more after alcohol ( weekends ) and only on his back.  He wakes at 6. 30 AM spontaneously. He is always able to wake without alarm. He feels refreshed some days, only after at least 6 hours of sleep. Often he is fatigued.   Sleep medical history and family sleep history: father was a snorer, lost weight- CPAP user. Mother is a Agricultural consultant. One brother who is  younger by 3.5 years sleeps well.    Social history: he practices good sleep hygiene. He doesn't have electronics in the bedroom- married, 2 children in school age. Sales rep , travels by car.  Non smoker, exerciser, ETOH- wine or beer on weekends, 2-3 beers per WE evening, caffeine - no soda, 2 cups of coffee in AM, sweet tea at lunch.     Review of Systems: Out of a complete 14 system review, the patient complains of only the following symptoms, and all other reviewed systems are negative. Snoring, early morning waking up- 10 out of 14 days.   Not sleepy , not fatigued, no naps. Central sleep apnea. How likely are you to doze in the following situations: 0 = not likely, 1 = slight chance, 2 = moderate chance, 3 = high chance  Sitting and Reading? Watching Television? Sitting inactive in a public place  (theater or meeting)? Lying down in the afternoon when circumstances permit? Sitting and talking to someone? Sitting quietly after lunch without alcohol? In a car, while stopped for a few minutes in traffic? As a passenger in a car for an hour without a break?  Total = 4    Denies anxiety, depression symptoms.  He does endorse racing thoughts.   Social History  Socioeconomic History  . Marital status: Married    Spouse name: Not on file  . Number of children: Not on file  . Years of education: Not on file  . Highest education level: Not on file  Occupational History  . Not on file  Tobacco Use  . Smoking status: Never Smoker  . Smokeless tobacco: Never Used  Substance and Sexual Activity  . Alcohol use: Yes    Alcohol/week: 6.0 standard drinks    Types: 6 Glasses of wine per week  . Drug use: Never  . Sexual activity: Not on file  Other Topics Concern  . Not on file  Social History Narrative  . Not on file   Social Determinants of Health   Financial Resource Strain:   . Difficulty of Paying Living Expenses: Not on file  Food Insecurity:   . Worried About Programme researcher, broadcasting/film/video in the Last Year: Not on file  . Ran Out of Food in the Last Year: Not on file  Transportation Needs:   . Lack of Transportation (Medical): Not on file  . Lack of Transportation (Non-Medical): Not on file  Physical Activity:   . Days of Exercise per Week: Not on file  . Minutes of Exercise per Session: Not on file  Stress:   . Feeling of Stress : Not on file  Social Connections:   . Frequency of Communication with Friends and Family: Not on file  . Frequency of Social Gatherings with Friends and Family: Not on file  . Attends Religious Services: Not on file  . Active Member of Clubs or Organizations: Not on file  . Attends Banker Meetings: Not on file  . Marital Status: Not on file  Intimate Partner Violence:   . Fear of Current or Ex-Partner: Not on file  . Emotionally  Abused: Not on file  . Physically Abused: Not on file  . Sexually Abused: Not on file    Family History  Problem Relation Age of Onset  . Healthy Mother   . Hypertension Father   . High Cholesterol Father   . Healthy Brother     Past Medical History:  Diagnosis Date  . Acne   . Allergic rhinitis   . GERD (gastroesophageal reflux disease)   . Insomnia   . Olecranon bursitis   . Pure hypercholesterolemia   . Vitamin [name] deficiency     Past Surgical History:  Procedure Laterality Date  . INGUINAL HERNIA REPAIR Right   . WISDOM TOOTH EXTRACTION      Current Outpatient Medications  Medication Sig Dispense Refill  . Cholecalciferol (VITAMIN [name]) 2000 units CAPS Take 1 capsule by mouth daily.    . cyclobenzaprine (AMRIX) 15 MG 24 hr capsule Take 1 capsule (15 mg total) by mouth daily as needed for muscle spasms. 30 capsule 0  . ISOtretinoin (ACCUTANE PO) Take by mouth.    . Lemborexant (DAYVIGO) 5 MG TABS Take 5 mg by mouth at bedtime as needed. 30 tablet 0  . MELATONIN PO Take 15 mg by mouth at bedtime as needed.    . Omega-3 Fatty Acids (FISH OIL) 500 MG CAPS Take 1 capsule by mouth daily.    . rosuvastatin (CRESTOR) 5 MG tablet TAKE 1 TABLET DAILY 90 tablet 3  . temazepam (RESTORIL) 15 MG capsule Take 1 capsule (15 mg total) by mouth at bedtime as needed for sleep. 60 capsule 2   No current facility-administered medications for this visit.    Allergies  as of 02/22/2019  . (No Known Allergies)    Vitals: BP 117/74   Pulse (!) 58   Temp (!) 96 F (35.6 C)   Ht 6\' 1"  (1.854 Curtis)   Wt 198 lb (89.8 kg)   BMI 26.12 kg/Curtis  Last Weight:  Wt Readings from Last 1 Encounters:  02/22/19 198 lb (89.8 kg)   02/24/19 mass index is 26.12 kg/Curtis.     Last Height:   Ht Readings from Last 1 Encounters:  02/22/19 6\' 1"  (1.854 Curtis)    Physical exam:  General: The patient is awake, alert and appears not in acute distress. The patient is well groomed. Head: Normocephalic, atraumatic.  Neck is supple. Mallampati 3,  neck circumference:16" Nasal airflow patent ,  no Retrognathia is seen.  Cardiovascular:  Regular rate and rhythm , without  murmurs or carotid bruit, and without distended neck veins. Respiratory: Lungs are clear to auscultation. Skin:  Without evidence of edema, or rash Trunk: BMI is 25 kg/m2. The patient's posture is erect. Neurologic exam : The patient is awake and alert, oriented to place and time.   Memory subjective  described as intact.  Attention span & concentration ability appears normal.  Speech is fluent.  Cranial nerves: no loss of smell or taste ( had these symptoms in late March - Covid ab positive  )  Pupils are equal and briskly reactive to light. Extraocular movements  in vertical and horizontal planes intact and without nystagmus. Visual fields by finger perimetry are intact.Hearing to finger rub intact.  Facial sensation intact to fine touch. Facial motor strength is symmetric and tongue and uvula move midline. Shoulder shrug was symmetrical.   Motor exam:   Normal tone, muscle bulk and symmetric strength in all extremities.Sensory:  Fine touch, pinprick and vibration were tested in all extremities. Coordination: normal without evidence of ataxia, dysmetria or tremor. Deep tendon reflexes:  intact.   Assessment:  After physical and neurologic examination, review of laboratory studies,  Personal review of imaging studies, reports of other /same  Imaging studies, results of polysomnography and / or neurophysiology testing and pre-existing records as far as provided in visit., my assessment is   1)  Central apnea with bradycardia is well controlled. Keep these settings as quoted above. 2)  Sleep is harder to maintain, to sustain through the early morning hours. Let's try  low dose SSRI- or Trazodone.  He has used melatonin already.    The patient was advised of the nature of the diagnosed disorder , the treatment options and the  risks for  general health and wellness arising from not treating the condition.   I spent more than 22 minutes of time with the patient and in preparation.  Greater than 50% of time was spent in counseling and coordination of care. We have discussed the diagnosis and differential and I answered the patient's questions.    Rv yearly.   , MD 02/22/2019, 9:41 AM  Certified in Neurology by ABPN Certified in Sleep Medicine by [facility] [address] [place], [address] [place]

## 2019-02-22 NOTE — Patient Instructions (Signed)
Trazodone tablets What is this medicine? TRAZODONE (TRAZ oh done) is used to treat depression. This medicine may be used for other purposes; ask your health care provider or pharmacist if you have questions. COMMON BRAND NAME(S): Desyrel What should I tell my health care provider before I take this medicine? They need to know if you have any of these conditions:  attempted suicide or thinking about it  bipolar disorder  bleeding problems  glaucoma  heart disease, or previous heart attack  irregular heart beat  kidney or liver disease  low levels of sodium in the blood  an unusual or allergic reaction to trazodone, other medicines, foods, dyes or preservatives  pregnant or trying to get pregnant  breast-feeding How should I use this medicine? Take this medicine by mouth with a glass of water. Follow the directions on the prescription label. Take this medicine shortly after a meal or a light snack. Take your medicine at regular intervals. Do not take your medicine more often than directed. Do not stop taking this medicine suddenly except upon the advice of your doctor. Stopping this medicine too quickly may cause serious side effects or your condition may worsen. A special MedGuide will be given to you by the pharmacist with each prescription and refill. Be sure to read this information carefully each time. Talk to your pediatrician regarding the use of this medicine in children. Special care may be needed. Overdosage: If you think you have taken too much of this medicine contact a poison control center or emergency room at once. NOTE: This medicine is only for you. Do not share this medicine with others. What if I miss a dose? If you miss a dose, take it as soon as you can. If it is almost time for your next dose, take only that dose. Do not take double or extra doses. What may interact with this medicine? Do not take this medicine with any of the following  medications:  certain medicines for fungal infections like fluconazole, itraconazole, ketoconazole, posaconazole, voriconazole  cisapride  dronedarone  linezolid  MAOIs like Carbex, Eldepryl, Marplan, Nardil, and Parnate  mesoridazine  methylene blue (injected into a vein)  pimozide  saquinavir  thioridazine This medicine may also interact with the following medications:  alcohol  antiviral medicines for HIV or AIDS  aspirin and aspirin-like medicines  barbiturates like phenobarbital  certain medicines for blood pressure, heart disease, irregular heart beat  certain medicines for depression, anxiety, or psychotic disturbances  certain medicines for migraine headache like almotriptan, eletriptan, frovatriptan, naratriptan, rizatriptan, sumatriptan, zolmitriptan  certain medicines for seizures like carbamazepine and phenytoin  certain medicines for sleep  certain medicines that treat or prevent blood clots like dalteparin, enoxaparin, warfarin  digoxin  fentanyl  lithium  NSAIDS, medicines for pain and inflammation, like ibuprofen or naproxen  other medicines that prolong the QT interval (cause an abnormal heart rhythm) like dofetilide  rasagiline  supplements like St. John's wort, kava kava, valerian  tramadol  tryptophan This list may not describe all possible interactions. Give your health care provider a list of all the medicines, herbs, non-prescription drugs, or dietary supplements you use. Also tell them if you smoke, drink alcohol, or use illegal drugs. Some items may interact with your medicine. What should I watch for while using this medicine? Tell your doctor if your symptoms do not get better or if they get worse. Visit your doctor or health care professional for regular checks on your progress. Because it may take   several weeks to see the full effects of this medicine, it is important to continue your treatment as prescribed by your  doctor. Patients and their families should watch out for new or worsening thoughts of suicide or depression. Also watch out for sudden changes in feelings such as feeling anxious, agitated, panicky, irritable, hostile, aggressive, impulsive, severely restless, overly excited and hyperactive, or not being able to sleep. If this happens, especially at the beginning of treatment or after a change in dose, call your health care professional. You may get drowsy or dizzy. Do not drive, use machinery, or do anything that needs mental alertness until you know how this medicine affects you. Do not stand or sit up quickly, especially if you are an older patient. This reduces the risk of dizzy or fainting spells. Alcohol may interfere with the effect of this medicine. Avoid alcoholic drinks. This medicine may cause dry eyes and blurred vision. If you wear contact lenses you may feel some discomfort. Lubricating drops may help. See your eye doctor if the problem does not go away or is severe. Your mouth may get dry. Chewing sugarless gum, sucking hard candy and drinking plenty of water may help. Contact your doctor if the problem does not go away or is severe. What side effects may I notice from receiving this medicine? Side effects that you should report to your doctor or health care professional as soon as possible:  allergic reactions like skin rash, itching or hives, swelling of the face, lips, or tongue  elevated mood, decreased need for sleep, racing thoughts, impulsive behavior  confusion  fast, irregular heartbeat  feeling faint or lightheaded, falls  feeling agitated, angry, or irritable  loss of balance or coordination  painful or prolonged erections  restlessness, pacing, inability to keep still  suicidal thoughts or other mood changes  tremors  trouble sleeping  seizures  unusual bleeding or bruising Side effects that usually do not require medical attention (report to your doctor  or health care professional if they continue or are bothersome):  change in sex drive or performance  change in appetite or weight  constipation  headache  muscle aches or pains  nausea This list may not describe all possible side effects. Call your doctor for medical advice about side effects. You may report side effects to FDA at 1-800-FDA-1088. Where should I keep my medicine? Keep out of the reach of children. Store at room temperature between 15 and 30 degrees C (59 to 86 degrees F). Protect from light. Keep container tightly closed. Throw away any unused medicine after the expiration date. NOTE: This sheet is a summary. It may not cover all possible information. If you have questions about this medicine, talk to your doctor, pharmacist, or health care provider.  2020 Elsevier/Gold Standard (2018-01-13 11:46:46)  

## 2019-03-10 ENCOUNTER — Ambulatory Visit (INDEPENDENT_AMBULATORY_CARE_PROVIDER_SITE_OTHER): Payer: Managed Care, Other (non HMO) | Admitting: Internal Medicine

## 2019-03-10 ENCOUNTER — Encounter: Payer: Self-pay | Admitting: Internal Medicine

## 2019-03-10 ENCOUNTER — Other Ambulatory Visit: Payer: Self-pay

## 2019-03-10 VITALS — BP 114/70 | HR 49 | Temp 97.8°F | Ht 73.0 in | Wt 197.4 lb

## 2019-03-10 DIAGNOSIS — Z79899 Other long term (current) drug therapy: Secondary | ICD-10-CM | POA: Diagnosis not present

## 2019-03-10 DIAGNOSIS — F5101 Primary insomnia: Secondary | ICD-10-CM | POA: Diagnosis not present

## 2019-03-10 DIAGNOSIS — E7800 Pure hypercholesterolemia, unspecified: Secondary | ICD-10-CM | POA: Diagnosis not present

## 2019-03-10 DIAGNOSIS — Z6826 Body mass index (BMI) 26.0-26.9, adult: Secondary | ICD-10-CM

## 2019-03-10 DIAGNOSIS — E663 Overweight: Secondary | ICD-10-CM

## 2019-03-10 MED ORDER — LEVOCETIRIZINE DIHYDROCHLORIDE 5 MG PO TABS: 5.0000 mg | ORAL_TABLET | Freq: Every evening | ORAL | 2 refills | Status: DC

## 2019-03-10 MED ORDER — DAYVIGO 5 MG PO TABS
5.0000 mg | ORAL_TABLET | Freq: Every evening | ORAL | 1 refills | Status: DC | PRN
Start: 2019-03-10 — End: 2019-03-16

## 2019-03-10 MED ORDER — ROSUVASTATIN CALCIUM 5 MG PO TABS: 5.0000 mg | ORAL_TABLET | Freq: Every day | ORAL | 3 refills | Status: DC

## 2019-03-10 NOTE — Patient Instructions (Signed)

## 2019-03-10 NOTE — Progress Notes (Signed)
This visit occurred during the SARS-CoV-2 public health emergency.  Safety protocols were in place, including screening questions prior to the visit, additional usage of staff PPE, and extensive cleaning of exam room while observing appropriate contact time as indicated for disinfecting solutions.  Subjective:     Patient ID: Curtis [name] , male    DOB: September 05, 1970 , 49 y.o.   MRN: [id number]   Chief Complaint  Patient presents with  . Hyperlipidemia  . Insomnia    HPI  He is here today for cholesterol check.  He reports compliance with meds.  He has not had any issues with the medication.   Hyperlipidemia This is a chronic problem. The problem is controlled. Pertinent negatives include no leg pain. Current antihyperlipidemic treatment includes statins. The current treatment provides moderate improvement of lipids.  Insomnia Primary symptoms: fragmented sleep, frequent awakening.  The current episode started more than one year. The problem occurs nightly. The problem has been gradually improving since onset. The symptoms are aggravated by anxiety. How many beverages per day that contain caffeine: 0 - 1.  Typical bedtime:  10-11 P.M..  PMH includes: work related stressors. Prior diagnostic workup includes:  Polysomnogram.     Past Medical History:  Diagnosis Date  . Acne   . Allergic rhinitis   . GERD (gastroesophageal reflux disease)   . Insomnia   . Olecranon bursitis   . Pure hypercholesterolemia   . Vitamin D deficiency      Family History  Problem Relation Age of Onset  . Healthy Mother   . Hypertension Father   . High Cholesterol Father   . Healthy Brother      Current Outpatient Medications:  .  Cholecalciferol (VITAMIN D) 2000 units CAPS, Take 1 capsule by mouth daily., Disp: , Rfl:  .  cyclobenzaprine (AMRIX) 15 MG 24 hr capsule, Take 1 capsule (15 mg total) by mouth daily as needed for muscle spasms., Disp: 30 capsule, Rfl: 0 .  ISOtretinoin (ACCUTANE PO), Take  15 mg by mouth. , Disp: , Rfl:  .  Lemborexant (DAYVIGO) 5 MG TABS, Take 5 mg by mouth at bedtime as needed., Disp: 90 tablet, Rfl: 1 .  levocetirizine (XYZAL) 5 MG tablet, Take 1 tablet (5 mg total) by mouth every evening., Disp: 90 tablet, Rfl: 2 .  MELATONIN PO, Take 15 mg by mouth at bedtime as needed., Disp: , Rfl:  .  Omega-3 Fatty Acids (FISH OIL) 500 MG CAPS, Take 1 capsule by mouth daily., Disp: , Rfl:  .  rosuvastatin (CRESTOR) 5 MG tablet, Take 1 tablet (5 mg total) by mouth daily., Disp: 90 tablet, Rfl: 3 .  temazepam (RESTORIL) 15 MG capsule, Take 1 capsule (15 mg total) by mouth at bedtime as needed for sleep., Disp: 60 capsule, Rfl: 2 .  traZODone (DESYREL) 50 MG tablet, Take 1 tablet (50 mg total) by mouth at bedtime as needed for sleep., Disp: 30 tablet, Rfl: 11   No Known Allergies   Review of Systems  Constitutional: Negative.   Respiratory: Negative.   Cardiovascular: Negative.   Gastrointestinal: Negative.   Neurological: Negative.   Psychiatric/Behavioral: The patient has insomnia.      Today's Vitals   03/10/19 1100  BP: 114/70  Pulse: (!) 49  Temp: 97.8 F (36.6 C)  TempSrc: Oral  Weight: 197 lb 6.4 oz (89.5 kg)  Height: '6\' 1"'$  (1.854 m)   Body mass index is 26.04 kg/m.   Objective:  Physical Exam Vitals and nursing  note reviewed.  Constitutional:      Appearance: Normal appearance.  Cardiovascular:     Rate and Rhythm: Normal rate and regular rhythm.     Heart sounds: Normal heart sounds.  Pulmonary:     Effort: Pulmonary effort is normal.     Breath sounds: Normal breath sounds.  Skin:    General: Skin is warm.  Neurological:     General: No focal deficit present.     Mental Status: He is alert.  Psychiatric:        Mood and Affect: Mood normal.         Assessment And Plan:     1. Pure hypercholesterolemia  Chronic, previous lab results reviewed. I will check lipid panel at his physical in six months.   2. Primary  insomnia  Chronic. He is doing well on trazodone nightly as per Neuro. He also uses temazepam and Dayvigo alternately, he feels that they are less effective if used consecutively. He was given refill of Dayvigo. Review of the [place] CSRS was performed in accordance of the [place] prior to dispensing any controlled drugs.   3. Overweight with body mass index (BMI) of 26 to 26.9 in adult   He is within 7 pounds of his optimal weight.  He is encouraged to continue exercising on less than five days weekly.   4. Drug therapy  - CMP14+EGFR     Maximino Greenland, MD    THE PATIENT IS ENCOURAGED TO PRACTICE SOCIAL DISTANCING DUE TO THE COVID-19 PANDEMIC.

## 2019-03-11 LAB — CMP14+EGFR
ALT: 30 IU/L (ref 0–44)
AST: 31 IU/L (ref 0–40)
Albumin/Globulin Ratio: 2.5 — ABNORMAL HIGH (ref 1.2–2.2)
Albumin: 4.8 g/dL (ref 4.0–5.0)
Alkaline Phosphatase: 77 IU/L (ref 39–117)
BUN/Creatinine Ratio: 8 — ABNORMAL LOW (ref 9–20)
BUN: 10 mg/dL (ref 6–24)
Bilirubin Total: 0.4 mg/dL (ref 0.0–1.2)
CO2: 24 mmol/L (ref 20–29)
Calcium: 9.7 mg/dL (ref 8.7–10.2)
Chloride: 104 mmol/L (ref 96–106)
Creatinine, Ser: 1.2 mg/dL (ref 0.76–1.27)
GFR calc Af Amer: 83 mL/min/{1.73_m2} (ref >59)
GFR calc non Af Amer: 72 mL/min/{1.73_m2} (ref >59)
Globulin, Total: 1.9 g/dL (ref 1.5–4.5)
Glucose: 85 mg/dL (ref 65–99)
Potassium: 4.4 mmol/L (ref 3.5–5.2)
Sodium: 142 mmol/L (ref 134–144)
Total Protein: 6.7 g/dL (ref 6.0–8.5)

## 2019-03-16 ENCOUNTER — Encounter: Payer: Self-pay | Admitting: Internal Medicine

## 2019-03-16 ENCOUNTER — Telehealth: Payer: Self-pay

## 2019-03-16 ENCOUNTER — Other Ambulatory Visit: Payer: Self-pay | Admitting: Internal Medicine

## 2019-03-16 MED ORDER — DAYVIGO 5 MG PO TABS: 5.0000 mg | ORAL_TABLET | Freq: Every evening | ORAL | 1 refills | Status: DC | PRN

## 2019-03-16 NOTE — Telephone Encounter (Signed)
Opened chart to see when dayvigo was sent

## 2019-03-17 ENCOUNTER — Other Ambulatory Visit: Payer: Self-pay | Admitting: Internal Medicine

## 2019-03-17 MED ORDER — DAYVIGO 5 MG PO TABS
5.0000 mg | ORAL_TABLET | Freq: Every evening | ORAL | 2 refills | Status: DC | PRN
Start: 2019-03-17 — End: 2019-09-29

## 2019-03-18 ENCOUNTER — Encounter: Payer: Self-pay | Admitting: Internal Medicine

## 2019-03-19 ENCOUNTER — Other Ambulatory Visit: Payer: Self-pay | Admitting: Internal Medicine

## 2019-04-22 ENCOUNTER — Encounter: Payer: Self-pay | Admitting: Internal Medicine

## 2019-04-29 ENCOUNTER — Ambulatory Visit: Payer: 59 | Admitting: Internal Medicine

## 2019-04-29 ENCOUNTER — Other Ambulatory Visit: Payer: Self-pay

## 2019-04-29 VITALS — BP 116/78 | HR 83 | Temp 97.7°F | Ht 73.0 in | Wt 193.2 lb

## 2019-04-29 DIAGNOSIS — Z111 Encounter for screening for respiratory tuberculosis: Secondary | ICD-10-CM

## 2019-04-29 DIAGNOSIS — F5101 Primary insomnia: Secondary | ICD-10-CM

## 2019-04-29 DIAGNOSIS — M7522 Bicipital tendinitis, left shoulder: Secondary | ICD-10-CM | POA: Diagnosis not present

## 2019-04-29 MED ORDER — MELOXICAM 15 MG PO TABS: 15.0000 mg | ORAL_TABLET | Freq: Every day | ORAL | 1 refills | Status: DC | PRN

## 2019-04-29 MED ORDER — TEMAZEPAM 15 MG PO CAPS: 15.0000 mg | ORAL_CAPSULE | Freq: Every evening | ORAL | 2 refills | Status: DC | PRN

## 2019-05-02 LAB — QUANTIFERON-TB GOLD PLUS
QuantiFERON Mitogen Value: >10 IU/mL
QuantiFERON Nil Value: 0.05 IU/mL
QuantiFERON TB1 Ag Value: 0.05 IU/mL
QuantiFERON TB2 Ag Value: 0.05 IU/mL
QuantiFERON-TB Gold Plus: NEGATIVE

## 2019-05-03 ENCOUNTER — Encounter: Payer: Self-pay | Admitting: Internal Medicine

## 2019-05-09 NOTE — Progress Notes (Signed)
This visit occurred during the SARS-CoV-2 public health emergency.  Safety protocols were in place, including screening questions prior to the visit, additional usage of staff PPE, and extensive cleaning of exam room while observing appropriate contact time as indicated for disinfecting solutions.  Subjective:     Patient ID: Curtis [name] , male    DOB: 09/07/1970 , 49 y.o.   MRN: [id number]   Chief Complaint  Patient presents with  . bicep tendonitis    quantiferon blood draw  . Medication Refill    temapaz    HPI  He is here today for further evaluation of left arm pain. He reports he thinks he hurt himself while lifting weights. He thinks he hurt himself while doing bicep curls. He now has pain near elbow.   He needs refill of temazepam. He has h/o chronic insomnia.   Medication Refill This is a chronic problem.     Past Medical History:  Diagnosis Date  . Acne   . Allergic rhinitis   . GERD (gastroesophageal reflux disease)   . Insomnia   . Olecranon bursitis   . Pure hypercholesterolemia   . Vitamin D deficiency      Family History  Problem Relation Age of Onset  . Healthy Mother   . Hypertension Father   . High Cholesterol Father   . Healthy Brother      Current Outpatient Medications:  .  Cholecalciferol (VITAMIN D) 2000 units CAPS, Take 1 capsule by mouth daily., Disp: , Rfl:  .  cyclobenzaprine (AMRIX) 15 MG 24 hr capsule, Take 1 capsule (15 mg total) by mouth daily as needed for muscle spasms., Disp: 30 capsule, Rfl: 0 .  ISOtretinoin (ACCUTANE PO), Take 15 mg by mouth. , Disp: , Rfl:  .  Lemborexant (DAYVIGO) 5 MG TABS, Take 5 mg by mouth at bedtime as needed., Disp: 30 tablet, Rfl: 2 .  levocetirizine (XYZAL) 5 MG tablet, Take 1 tablet (5 mg total) by mouth every evening., Disp: 90 tablet, Rfl: 2 .  MELATONIN PO, Take 15 mg by mouth at bedtime as needed., Disp: , Rfl:  .  Omega-3 Fatty Acids (FISH OIL) 500 MG CAPS, Take 1 capsule by mouth daily., Disp: ,  Rfl:  .  rosuvastatin (CRESTOR) 5 MG tablet, Take 1 tablet (5 mg total) by mouth daily., Disp: 90 tablet, Rfl: 3 .  temazepam (RESTORIL) 15 MG capsule, Take 1 capsule (15 mg total) by mouth at bedtime as needed for sleep., Disp: 60 capsule, Rfl: 2 .  traZODone (DESYREL) 50 MG tablet, Take 1 tablet (50 mg total) by mouth at bedtime as needed for sleep., Disp: 30 tablet, Rfl: 11 .  meloxicam (MOBIC) 15 MG tablet, Take 1 tablet (15 mg total) by mouth daily as needed for pain., Disp: 30 tablet, Rfl: 1   No Known Allergies   Review of Systems  Constitutional: Negative.   Respiratory: Negative.   Cardiovascular: Negative.   Gastrointestinal: Negative.   Neurological: Negative.   Psychiatric/Behavioral: Positive for sleep disturbance.     Today's Vitals   04/29/19 1142  BP: 116/78  Pulse: 83  Temp: 97.7 F (36.5 C)  TempSrc: Oral  SpO2: 92%  Weight: 193 lb 3.2 oz (87.6 kg)  Height: 6\' 1"  (1.854 m)   Body mass index is 25.49 kg/m.   Objective:  Physical Exam Vitals and nursing note reviewed.  Constitutional:      Appearance: Normal appearance.  Cardiovascular:     Rate and Rhythm: Normal rate  and regular rhythm.     Heart sounds: Normal heart sounds.  Pulmonary:     Effort: Pulmonary effort is normal.     Breath sounds: Normal breath sounds.  Musculoskeletal:     Comments: Tenderness at palpation of distal biceps tendon. No overlying erythema.   Skin:    General: Skin is warm.  Neurological:     General: No focal deficit present.     Mental Status: He is alert.  Psychiatric:        Mood and Affect: Mood normal.         Assessment And Plan:     1. Biceps tendinitis of left upper extremity  He was given rx meloxicam to use daily prn. I will also refer him to Ortho if needed. He is encouraged to let me know if his sx persist.   2. Screening-pulmonary TB  - QuantiFERON-TB Gold Plus  3. Primary insomnia  Chronic. He was given rx temazepam refill as requested. He  is encouraged to have bedtime routine.         Maximino Greenland, MD    THE PATIENT IS ENCOURAGED TO PRACTICE SOCIAL DISTANCING DUE TO THE COVID-19 PANDEMIC.

## 2019-06-23 ENCOUNTER — Other Ambulatory Visit: Payer: Self-pay | Admitting: Internal Medicine

## 2019-08-23 ENCOUNTER — Ambulatory Visit: Payer: 59 | Admitting: Family Medicine

## 2019-08-23 ENCOUNTER — Other Ambulatory Visit: Payer: Self-pay

## 2019-08-23 ENCOUNTER — Encounter: Payer: Self-pay | Admitting: Family Medicine

## 2019-08-23 VITALS — BP 116/72 | HR 65 | Ht 73.0 in | Wt 198.0 lb

## 2019-08-23 DIAGNOSIS — F5103 Paradoxical insomnia: Secondary | ICD-10-CM | POA: Diagnosis not present

## 2019-08-23 DIAGNOSIS — G4731 Primary central sleep apnea: Secondary | ICD-10-CM | POA: Diagnosis not present

## 2019-08-23 DIAGNOSIS — G4730 Sleep apnea, unspecified: Secondary | ICD-10-CM | POA: Diagnosis not present

## 2019-08-23 MED ORDER — TRAZODONE HCL 50 MG PO TABS: 50.0000 mg | ORAL_TABLET | Freq: Every evening | ORAL | 3 refills | Status: DC | PRN

## 2019-08-23 NOTE — Patient Instructions (Signed)
Please continue using your CPAP regularly. While your insurance requires that you use CPAP at least 4 hours each night on 70% of the nights, I recommend, that you not skip any nights and use it throughout the night if you can. Getting used to CPAP and staying with the treatment long term does take time and patience and discipline. Untreated obstructive sleep apnea when it is moderate to severe can have an adverse impact on cardiovascular health and raise her risk for heart disease, arrhythmias, hypertension, congestive heart failure, stroke and diabetes. Untreated obstructive sleep apnea causes sleep disruption, nonrestorative sleep, and sleep deprivation. This can have an impact on your day to day functioning and cause daytime sleepiness and impairment of cognitive function, memory loss, mood disturbance, and problems focussing. Using CPAP regularly can improve these symptoms.   Continue trazodone 50mg  daily at bedtime. Sleep hygiene advised.    Follow up in 1 year    Insomnia Insomnia is a sleep disorder that makes it difficult to fall asleep or stay asleep. Insomnia can cause fatigue, low energy, difficulty concentrating, mood swings, and poor performance at work or school. There are three different ways to classify insomnia:  Difficulty falling asleep.  Difficulty staying asleep.  Waking up too early in the morning. Any type of insomnia can be long-term (chronic) or short-term (acute). Both are common. Short-term insomnia usually lasts for three months or less. Chronic insomnia occurs at least three times a week for longer than three months. What are the causes? Insomnia may be caused by another condition, situation, or substance, such as:  Anxiety.  Certain medicines.  Gastroesophageal reflux disease (GERD) or other gastrointestinal conditions.  Asthma or other breathing conditions.  Restless legs syndrome, sleep apnea, or other sleep disorders.  Chronic  pain.  Menopause.  Stroke.  Abuse of alcohol, tobacco, or illegal drugs.  Mental health conditions, such as depression.  Caffeine.  Neurological disorders, such as Alzheimer's disease.  An overactive thyroid (hyperthyroidism). Sometimes, the cause of insomnia may not be known. What increases the risk? Risk factors for insomnia include:  Gender. Women are affected more often than men.  Age. Insomnia is more common as you get older.  Stress.  Lack of exercise.  Irregular work schedule or working night shifts.  Traveling between different time zones.  Certain medical and mental health conditions. What are the signs or symptoms? If you have insomnia, the main symptom is having trouble falling asleep or having trouble staying asleep. This may lead to other symptoms, such as:  Feeling fatigued or having low energy.  Feeling nervous about going to sleep.  Not feeling rested in the morning.  Having trouble concentrating.  Feeling irritable, anxious, or depressed. How is this diagnosed? This condition may be diagnosed based on:  Your symptoms and medical history. Your health care provider may ask about: ? Your sleep habits. ? Any medical conditions you have. ? Your mental health.  A physical exam. How is this treated? Treatment for insomnia depends on the cause. Treatment may focus on treating an underlying condition that is causing insomnia. Treatment may also include:  Medicines to help you sleep.  Counseling or therapy.  Lifestyle adjustments to help you sleep better. Follow these instructions at home: Eating and drinking   Limit or avoid alcohol, caffeinated beverages, and cigarettes, especially close to bedtime. These can disrupt your sleep.  Do not eat a large meal or eat spicy foods right before bedtime. This can lead to digestive discomfort that can  make it hard for you to sleep. Sleep habits   Keep a sleep diary to help you and your health care  provider figure out what could be causing your insomnia. Write down: ? When you sleep. ? When you wake up during the night. ? How well you sleep. ? How rested you feel the next day. ? Any side effects of medicines you are taking. ? What you eat and drink.  Make your bedroom a dark, comfortable place where it is easy to fall asleep. ? Put up shades or blackout curtains to block light from outside. ? Use a white noise machine to block noise. ? Keep the temperature cool.  Limit screen use before bedtime. This includes: ? Watching TV. ? Using your smartphone, tablet, or computer.  Stick to a routine that includes going to bed and waking up at the same times every day and night. This can help you fall asleep faster. Consider making a quiet activity, such as reading, part of your nighttime routine.  Try to avoid taking naps during the day so that you sleep better at night.  Get out of bed if you are still awake after 15 minutes of trying to sleep. Keep the lights down, but try reading or doing a quiet activity. When you feel sleepy, go back to bed. General instructions  Take over-the-counter and prescription medicines only as told by your health care provider.  Exercise regularly, as told by your health care provider. Avoid exercise starting several hours before bedtime.  Use relaxation techniques to manage stress. Ask your health care provider to suggest some techniques that may work well for you. These may include: ? Breathing exercises. ? Routines to release muscle tension. ? Visualizing peaceful scenes.  Make sure that you drive carefully. Avoid driving if you feel very sleepy.  Keep all follow-up visits as told by your health care provider. This is important. Contact a health care provider if:  You are tired throughout the day.  You have trouble in your daily routine due to sleepiness.  You continue to have sleep problems, or your sleep problems get worse. Get help right  away if:  You have serious thoughts about hurting yourself or someone else. If you ever feel like you may hurt yourself or others, or have thoughts about taking your own life, get help right away. You can go to your nearest emergency department or call:  Your local emergency services (911 in the [place]  A suicide crisis helpline, such as the National Suicide Prevention Lifeline at 223-561-4754. This is open 24 hours a day. Summary  Insomnia is a sleep disorder that makes it difficult to fall asleep or stay asleep.  Insomnia can be long-term (chronic) or short-term (acute).  Treatment for insomnia depends on the cause. Treatment may focus on treating an underlying condition that is causing insomnia.  Keep a sleep diary to help you and your health care provider figure out what could be causing your insomnia. This information is not intended to replace advice given to you by your health care provider. Make sure you discuss any questions you have with your health care provider. Document Revised: 01/03/2017 Document Reviewed: 10/31/2016 Elsevier Patient Education  2020 [employer]    Sleep Apnea Sleep apnea affects breathing during sleep. It causes breathing to stop for a short time or to become shallow. It can also increase the risk of:  Heart attack.  Stroke.  Being very overweight (obese).  Diabetes.  Heart failure.  Irregular heartbeat. The goal of treatment is to help you breathe normally again. What are the causes? There are three kinds of sleep apnea:  Obstructive sleep apnea. This is caused by a blocked or collapsed airway.  Central sleep apnea. This happens when the brain does not send the right signals to the muscles that control breathing.  Mixed sleep apnea. This is a combination of obstructive and central sleep apnea. The most common cause of this condition is a collapsed or blocked airway. This can happen if:  Your throat muscles are too relaxed.  Your  tongue and tonsils are too large.  You are overweight.  Your airway is too small. What increases the risk?  Being overweight.  Smoking.  Having a small airway.  Being older.  Being male.  Drinking alcohol.  Taking medicines to calm yourself (sedatives or tranquilizers).  Having family members with the condition. What are the signs or symptoms?  Trouble staying asleep.  Being sleepy or tired during the day.  Getting angry a lot.  Loud snoring.  Headaches in the morning.  Not being able to focus your mind (concentrate).  Forgetting things.  Less interest in sex.  Mood swings.  Personality changes.  Feelings of sadness (depression).  Waking up a lot during the night to pee (urinate).  Dry mouth.  Sore throat. How is this diagnosed?  Your medical history.  A physical exam.  A test that is done when you are sleeping (sleep study). The test is most often done in a sleep lab but may also be done at home. How is this treated?   Sleeping on your side.  Using a medicine to get rid of mucus in your nose (decongestant).  Avoiding the use of alcohol, medicines to help you relax, or certain pain medicines (narcotics).  Losing weight, if needed.  Changing your diet.  Not smoking.  Using a machine to open your airway while you sleep, such as: ? An oral appliance. This is a mouthpiece that shifts your lower jaw forward. ? A CPAP device. This device blows air through a mask when you breathe out (exhale). ? An EPAP device. This has valves that you put in each nostril. ? A BPAP device. This device blows air through a mask when you breathe in (inhale) and breathe out.  Having surgery if other treatments do not work. It is important to get treatment for sleep apnea. Without treatment, it can lead to:  High blood pressure.  Coronary artery disease.  In men, not being able to have an erection (impotence).  Reduced thinking ability. Follow these  instructions at home: Lifestyle  Make changes that your doctor recommends.  Eat a healthy diet.  Lose weight if needed.  Avoid alcohol, medicines to help you relax, and some pain medicines.  Do not use any products that contain nicotine or tobacco, such as cigarettes, e-cigarettes, and chewing tobacco. If you need help quitting, ask your doctor. General instructions  Take over-the-counter and prescription medicines only as told by your doctor.  If you were given a machine to use while you sleep, use it only as told by your doctor.  If you are having surgery, make sure to tell your doctor you have sleep apnea. You may need to bring your device with you.  Keep all follow-up visits as told by your doctor. This is important. Contact a doctor if:  The machine that you were given to use during sleep bothers you or does not seem to  be working.  You do not get better.  You get worse. Get help right away if:  Your chest hurts.  You have trouble breathing in enough air.  You have an uncomfortable feeling in your back, arms, or stomach.  You have trouble talking.  One side of your body feels weak.  A part of your face is hanging down. These symptoms may be an emergency. Do not wait to see if the symptoms will go away. Get medical help right away. Call your local emergency services (911 in the [place] Do not drive yourself to the hospital. Summary  This condition affects breathing during sleep.  The most common cause is a collapsed or blocked airway.  The goal of treatment is to help you breathe normally while you sleep. This information is not intended to replace advice given to you by your health care provider. Make sure you discuss any questions you have with your health care provider. Document Revised: 11/07/2017 Document Reviewed: 09/16/2017 Elsevier Patient Education  [place].

## 2019-08-23 NOTE — Progress Notes (Signed)
PATIENT: Curtis [name] DOB: Jun 12, 1970  REASON FOR VISIT: follow up HISTORY FROM: patient  Chief Complaint  Patient presents with  . Follow-up    rm 1 here for a f/u on osa. Pt is having no problems     HISTORY OF PRESENT ILLNESS: Today 08/23/19 Curtis [name] is a 49 y.o. male here today for follow up for central sleep apnea treated with CPAP and insomnia on trazodone 50mg  daily. He reports that he is doing well. He is using CPAP nightly. Trazodone has helped him stay asleep longer. He seems to wake most days around 4-5am. He also takes melatonin every night and occasionally will take temazepam prescribed by PCP. He is feeling well today and without concerns.   Compliance report dated 07/20/2019 through 08/18/2019 reveals that he has used CPAP 29 of the past 30 days for compliance of 97%.  He used CPAP greater than 4 hours 27 of the past 30 days for compliance of 90%.  Average usage was 7 hours and 45 minutes.  Residual AHI was 2.0 on 5 to 9 cm of water and an EPR of 2.  There was no leak noted.  HISTORY: (copied from Dr Dohmeier's note on 02/22/2019)  RV 02-22-2019, I have the pleasure of meeting with 02-24-2019 today, an established complex sleep apnea patient ,who also has insomnia. While his primary central sleep apnea has been controlled he still has difficulties to  sleep through the night. His compliance on his air sense 10 machine is excellent he uses a very small pressure window between 5 and 9 cmH2O this 2 cm expiratory pressure relief, average use at time 6 hours 25 minutes and he has a 93% compliance by days.  His residual AHI is 2.3 and the residual apneas are mostly central at 1.5/h however this is a good resolution of apnea.  She endorsed the fatigue severity scale 24 points out of 63.  He endorsed the Epworth sleepiness score at only 4 out of 24 points. He goes to sleep around 10 10:30 PM but he has problems with waking up early at around 4 AM but he does not need to get up and  it is not desired to get up.  However there are also days and weeks where he can catch 7 hours of sleep alternating with days and nights of 5 or 5-1/2 hours of sleep.  The early morning awakening may be related to low serotonin levels.  I will be happy to discuss cyclic sleep disorders here today.    02-18-2018,  RV after 2 sleep studies.  Curtis [name] is a 49 y.o. caucasian, right handed  male patient and was seen in a referral from Dr. 49 for a sleep medicine consultation.  Curtis [name] underwent a baseline polysomnography with an expanded EEG montage of 29 September 2017 which ended up in a surprising result he had by far dominating central apneas of obstructive apneas.  58 central apneas were seen 10 obstructive 7 2 mixed as well as 44 hypopneas.  The total AHI was 17.5 in the mild-to-moderate range of the RDI was not higher.  During REM sleep there was a less apnea which is a typical distribution for a central apnea patient.  In supine sleep there were 28.5 versus nonsupine 6.0/h.  He also had sinus bradycardia which can be also related to his athletic activities.  Primary snoring was noted and be due to the central apnea nature we asked him to return for an attended  CPAP titration which took place on 7 October.  The AHI was reduced to 5.2 overall at a final pressure of 8 cmH2O there was a reduction of 0.0 AHI and 100% sleep efficiency.  The technologist gave the patient a Simplus mask which is a fullface model in large size.  He is now using an auto titration CPAP with a pressure range between 5 and 9 cmH2O and one 1 cm EPR his compliance was 97% by days and 25 of those days were over 4 hours of daily use with an average user time of 6 hours and 8 minutes.  The residual AHI is 5.0 and the residual apneas are still central in nature.  However we manage not to provoke more central apneas.  He feels as if he is still not sleeping super long, but his sleep has improved, his sleep quality is more  restorative. Bradycardia. He trains 4 days a week, cardio and weight lifting.   Epworth changed on CPAP to 2 points and FSS to 13 points.    HPI: Chief complaint according to patient : "Increasingly fatigued, and sleepy. I have wake up early and can't go back to sleep ". He works for [employer] as a Psychologist, counselling. Curtis [name] reports that he has noted certain cycles or phases of sleep changes.  There will be times where he wakes up at the exact time every morning for example 3; 40 minutes AM and after a couple of weeks or so this again will change. It has never been difficult for him to go to sleep in the first place but if he goes to early he also wakes up early.  He craves to be able to sleep 7 or 8 hours but this has rarely happened. He hasn't slept in daytime , has never been a "napper".  Sleep habits are as follows: The patient aims for a bedtime between 10 and 11 PM, if he would go to sleep earlier he would wake up earlier and he always avoids going to sleep later than midnight. He feels pretty good at this routine. Bedroom is cool , quiet and dark. He shares the bedroom with his wife, but he feels usually better when alone in bed. He sleeps poorly in hotels.  Kids are 8 and 11, no pets in the bedroom. Bathroom break will wake him up some days, mostly around 3 or 4 AM.  He has vivid dreams- but feels these don't wake him, no pain or discomfort that interrupts his sleep. Used to have some low back pain that has resolved.   No enactment, no sleep . Wife reports snoring, mildly and more after alcohol ( weekends ) and only on his back.  He wakes at 6. 30 AM spontaneously. He is always able to wake without alarm. He feels refreshed some days, only after at least 6 hours of sleep. Often he is fatigued.   Sleep medical history and family sleep history: father was a snorer, lost weight- CPAP user. Mother is a Agricultural consultant. One brother who is  younger by 3.5 years sleeps well.    Social history: he  practices good sleep hygiene. He doesn't have electronics in the bedroom- married, 2 children in school age. Sales rep , travels by car.  Non smoker, exerciser, ETOH- wine or beer on weekends, 2-3 beers per WE evening, caffeine - no soda, 2 cups of coffee in AM, sweet tea at lunch.      REVIEW OF SYSTEMS: Out of a complete  14 system review of symptoms, the patient complains only of the following symptoms, insomnia and all other reviewed systems are negative.   ALLERGIES: No Known Allergies  HOME MEDICATIONS: Outpatient Medications Prior to Visit  Medication Sig Dispense Refill  . Cholecalciferol (VITAMIN D) 2000 units CAPS Take 1 capsule by mouth daily.    . cyclobenzaprine (AMRIX) 15 MG 24 hr capsule Take 1 capsule (15 mg total) by mouth daily as needed for muscle spasms. 30 capsule 0  . Lemborexant (DAYVIGO) 5 MG TABS Take 5 mg by mouth at bedtime as needed. 30 tablet 2  . levocetirizine (XYZAL) 5 MG tablet Take 1 tablet (5 mg total) by mouth every evening. 90 tablet 2  . MELATONIN PO Take 15 mg by mouth at bedtime as needed.    . meloxicam (MOBIC) 15 MG tablet TAKE 1 TABLET(15 MG) BY MOUTH DAILY AS NEEDED FOR PAIN 30 tablet 1  . Omega-3 Fatty Acids (FISH OIL) 500 MG CAPS Take 1 capsule by mouth daily.    . rosuvastatin (CRESTOR) 5 MG tablet Take 1 tablet (5 mg total) by mouth daily. 90 tablet 3  . temazepam (RESTORIL) 15 MG capsule Take 1 capsule (15 mg total) by mouth at bedtime as needed for sleep. 60 capsule 2  . traZODone (DESYREL) 50 MG tablet Take 1 tablet (50 mg total) by mouth at bedtime as needed for sleep. 30 tablet 11  . ISOtretinoin (ACCUTANE PO) Take 15 mg by mouth.      No facility-administered medications prior to visit.    PAST MEDICAL HISTORY: Past Medical History:  Diagnosis Date  . Acne   . Allergic rhinitis   . GERD (gastroesophageal reflux disease)   . Insomnia   . Olecranon bursitis   . Pure hypercholesterolemia   . Vitamin D deficiency     PAST  SURGICAL HISTORY: Past Surgical History:  Procedure Laterality Date  . INGUINAL HERNIA REPAIR Right   . WISDOM TOOTH EXTRACTION      FAMILY HISTORY: Family History  Problem Relation Age of Onset  . Healthy Mother   . Hypertension Father   . High Cholesterol Father   . Healthy Brother     SOCIAL HISTORY: Social History   Socioeconomic History  . Marital status: Married    Spouse name: Not on file  . Number of children: Not on file  . Years of education: Not on file  . Highest education level: Not on file  Occupational History  . Not on file  Tobacco Use  . Smoking status: Never Smoker  . Smokeless tobacco: Never Used  Vaping Use  . Vaping Use: Never used  Substance and Sexual Activity  . Alcohol use: Yes    Alcohol/week: 6.0 standard drinks    Types: 6 Glasses of wine per week  . Drug use: Never  . Sexual activity: Not on file  Other Topics Concern  . Not on file  Social History Narrative  . Not on file   Social Determinants of Health   Financial Resource Strain:   . Difficulty of Paying Living Expenses:   Food Insecurity:   . Worried About Programme researcher, broadcasting/film/video in the Last Year:   . Barista in the Last Year:   Transportation Needs:   . Freight forwarder (Medical):   Marland Kitchen Lack of Transportation (Non-Medical):   Physical Activity:   . Days of Exercise per Week:   . Minutes of Exercise per Session:   Stress:   .  Feeling of Stress :   Social Connections:   . Frequency of Communication with Friends and Family:   . Frequency of Social Gatherings with Friends and Family:   . Attends Religious Services:   . Active Member of Clubs or Organizations:   . Attends Banker Meetings:   Marland Kitchen Marital Status:   Intimate Partner Violence:   . Fear of Current or Ex-Partner:   . Emotionally Abused:   Marland Kitchen Physically Abused:   . Sexually Abused:       PHYSICAL EXAM  Vitals:   08/23/19 0923  Weight: 198 lb (89.8 kg)  Height:  (1.854 m)    Body mass index is 26.12 kg/m.  Generalized: Well developed, in no acute distress  Cardiology: normal rate and rhythm, no murmur noted Respiratory: clear to auscultation bilaterally  Neurological examination  Mentation: Alert oriented to time, place, history taking. Follows all commands speech and language fluent Cranial nerve II-XII: Pupils were equal round reactive to light. Extraocular movements were full, visual field were full  Motor: The motor testing reveals 5 over 5 strength of all 4 extremities. Good symmetric motor tone is noted throughout.   Gait and station: Gait is normal.   DIAGNOSTIC DATA (LABS, IMAGING, TESTING) - I reviewed patient records, labs, notes, testing and imaging myself where available.  No flowsheet data found.   Lab Results  Component Value Date   WBC 4.0 09/07/2018   HGB 16.0 09/07/2018   HCT 48.0 09/07/2018   MCV 92 09/07/2018   PLT 207 09/07/2018      Component Value Date/Time   NA 142 03/10/2019 1122   K 4.4 03/10/2019 1122   CL 104 03/10/2019 1122   CO2 24 03/10/2019 1122   GLUCOSE 85 03/10/2019 1122   BUN 10 03/10/2019 1122   CREATININE 1.20 03/10/2019 1122   CALCIUM 9.7 03/10/2019 1122   PROT 6.7 03/10/2019 1122   ALBUMIN 4.8 03/10/2019 1122   AST 31 03/10/2019 1122   ALT 30 03/10/2019 1122   ALKPHOS 77 03/10/2019 1122   BILITOT 0.4 03/10/2019 1122   GFRNONAA 72 03/10/2019 1122   GFRAA 83 03/10/2019 1122   Lab Results  Component Value Date   CHOL 157 09/07/2018   HDL 68 09/07/2018   LDLCALC 79 09/07/2018   TRIG 52 09/07/2018   CHOLHDL 2.3 09/07/2018   Lab Results  Component Value Date   HGBA1C 5.1 09/07/2018   No results found for: VITAMINB12 No results found for: TSH     ASSESSMENT AND PLAN 49 y.o. year old male  has a past medical history of Acne, Allergic rhinitis, GERD (gastroesophageal reflux disease), Insomnia, Olecranon bursitis, Pure hypercholesterolemia, and Vitamin D deficiency. here with     ICD-10-CM    1. 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 Sleep apnea with use of continuous positive airway pressure (CPAP)  G47.30     Curtis [name] is doing well on CPAP therapy.  Compliance report reveals excellent compliance.  He was encouraged to continue using CPAP nightly and for greater than 4 hours each night.  He does feel that insomnia has improved significantly with trazodone 50 mg at bedtime.  We will continue this medication.  He will continue to use melatonin nightly and temazepam as needed as directed by primary care.  Healthy lifestyle habits encouraged.  He will follow-up with [place] in 1 year, sooner if needed.  He verbalizes understanding and agreement with this  plan.   No orders of the defined types were placed in this encounter.    No orders of the defined types were placed in this encounter.     I spent 15 minutes with the patient. 50% of this time was spent counseling and educating patient on plan of care and medications.    Shawnie Dappermy Johonna Binette, FNP-C 08/23/2019, 9:44 AM [facility] [address], [address] [place], [place] [address] [phone]

## 2019-08-26 ENCOUNTER — Encounter: Payer: Self-pay | Admitting: Family Medicine

## 2019-08-30 ENCOUNTER — Other Ambulatory Visit: Payer: Self-pay | Admitting: Internal Medicine

## 2019-09-15 ENCOUNTER — Encounter: Payer: BC Managed Care – PPO | Admitting: Internal Medicine

## 2019-09-28 ENCOUNTER — Encounter: Payer: Self-pay | Admitting: Internal Medicine

## 2019-09-28 ENCOUNTER — Other Ambulatory Visit: Payer: Self-pay | Admitting: Internal Medicine

## 2019-09-28 NOTE — Patient Instructions (Signed)

## 2019-09-28 NOTE — Telephone Encounter (Signed)
[place] refill 

## 2019-09-29 ENCOUNTER — Other Ambulatory Visit: Payer: Self-pay

## 2019-09-29 ENCOUNTER — Ambulatory Visit (INDEPENDENT_AMBULATORY_CARE_PROVIDER_SITE_OTHER): Payer: 59 | Admitting: Internal Medicine

## 2019-09-29 ENCOUNTER — Encounter: Payer: 59 | Admitting: Internal Medicine

## 2019-09-29 VITALS — BP 126/64 | HR 60 | Temp 97.4°F | Ht 71.2 in | Wt 195.2 lb

## 2019-09-29 DIAGNOSIS — F419 Anxiety disorder, unspecified: Secondary | ICD-10-CM

## 2019-09-29 DIAGNOSIS — E559 Vitamin D deficiency, unspecified: Secondary | ICD-10-CM

## 2019-09-29 DIAGNOSIS — Z79899 Other long term (current) drug therapy: Secondary | ICD-10-CM | POA: Diagnosis not present

## 2019-09-29 DIAGNOSIS — Z0000 Encounter for general adult medical examination without abnormal findings: Secondary | ICD-10-CM

## 2019-09-29 MED ORDER — ESCITALOPRAM OXALATE 10 MG PO TABS: 10.0000 mg | ORAL_TABLET | Freq: Every day | ORAL | 2 refills | Status: DC

## 2019-09-29 NOTE — Progress Notes (Addendum)
I,Tianna Badgett,acting as a Education administrator for Maximino Greenland, MD.,have documented all relevant documentation on the behalf of Maximino Greenland, MD,as directed by  Maximino Greenland, MD while in the presence of Maximino Greenland, MD.  This visit occurred during the SARS-CoV-2 public health emergency.  Safety protocols were in place, including screening questions prior to the visit, additional usage of staff PPE, and extensive cleaning of exam room while observing appropriate contact time as indicated for disinfecting solutions.  Subjective:     Patient ID: Curtis [name] , male    DOB: 1970-09-15 , 49 y.o.   MRN: [id number]   Chief Complaint  Patient presents with  . Annual Exam    HPI  He is here today for a full physical examination. He has no specific concerns or complaints at this time.     Past Medical History:  Diagnosis Date  . Acne   . Allergic rhinitis   . GERD (gastroesophageal reflux disease)   . Insomnia   . Olecranon bursitis   . Pure hypercholesterolemia   . Vitamin D deficiency      Family History  Problem Relation Age of Onset  . Healthy Mother   . Hypertension Father   . High Cholesterol Father   . Healthy Brother      Current Outpatient Medications:  .  Cholecalciferol (VITAMIN D) 2000 units CAPS, Take 1 capsule by mouth daily., Disp: , Rfl:  .  cyclobenzaprine (AMRIX) 15 MG 24 hr capsule, Take 1 capsule (15 mg total) by mouth daily as needed for muscle spasms., Disp: 30 capsule, Rfl: 0 .  levocetirizine (XYZAL) 5 MG tablet, Take 1 tablet (5 mg total) by mouth every evening., Disp: 90 tablet, Rfl: 2 .  MELATONIN PO, Take 15 mg by mouth at bedtime as needed., Disp: , Rfl:  .  meloxicam (MOBIC) 15 MG tablet, TAKE 1 TABLET(15 MG) BY MOUTH DAILY AS NEEDED FOR PAIN, Disp: 30 tablet, Rfl: 1 .  Omega-3 Fatty Acids (FISH OIL) 500 MG CAPS, Take 1 capsule by mouth daily., Disp: , Rfl:  .  rosuvastatin (CRESTOR) 5 MG tablet, Take 1 tablet (5 mg total) by mouth daily., Disp: 90  tablet, Rfl: 3 .  temazepam (RESTORIL) 15 MG capsule, Take 1 capsule (15 mg total) by mouth at bedtime as needed for sleep., Disp: 60 capsule, Rfl: 2 .  DAYVIGO 5 MG TABS, TAKE 1 TABLET BY MOUTH AT BEDTIME AS NEEDED, Disp: 30 tablet, Rfl: 2 .  escitalopram (LEXAPRO) 10 MG tablet, Take 1 tablet (10 mg total) by mouth daily., Disp: 30 tablet, Rfl: 2   No Known Allergies   Men's preventive visit. Patient Health Questionnaire (PHQ-2) is    Office Visit from 09/29/2019 in [facility]  PHQ-2 Total Score 0    . Patient is on a healthy diet. Marital status: Married. Relevant history for alcohol use is:  Social History   Substance and Sexual Activity  Alcohol Use Yes  . Alcohol/week: 6.0 standard drinks  . Types: 6 Glasses of wine per week  . Relevant history for tobacco use is:  Social History   Tobacco Use  Smoking Status Never Smoker  Smokeless Tobacco Never Used  .   Review of Systems  Constitutional: Negative.   HENT: Negative.   Eyes: Negative.   Respiratory: Negative.   Cardiovascular: Negative.   Gastrointestinal: Negative.   Endocrine: Negative.   Genitourinary: Negative.   Musculoskeletal: Negative.   Skin: Negative.   Allergic/Immunologic: Negative.  Neurological: Negative.   Hematological: Negative.   Psychiatric/Behavioral: Positive for sleep disturbance. The patient is nervous/anxious.        He c/o anxiety. States neurologist stated treatment may also help his sleep issues. He often has things on his mind when trying to sleep. His wife reportedly states he is always high strung.      Today's Vitals   09/29/19 0856  BP: 126/64  Pulse: 60  Temp: (!) 97.4 F (36.3 C)  TempSrc: Oral  Weight: 195 lb 3.2 oz (88.5 kg)  Height: 5' 11.2" (1.808 m)  PainSc: 0-No pain   Body mass index is 27.07 kg/m.   Objective:  Physical Exam Vitals and nursing note reviewed.  Constitutional:      Appearance: Normal appearance.  HENT:     Head:  Normocephalic and atraumatic.     Right Ear: Tympanic membrane, ear canal and external ear normal.     Left Ear: Tympanic membrane, ear canal and external ear normal.     Nose:     Comments: Deferred, masked    Mouth/Throat:     Comments: Deferred, masked Eyes:     Extraocular Movements: Extraocular movements intact.     Conjunctiva/sclera: Conjunctivae normal.     Pupils: Pupils are equal, round, and reactive to light.  Cardiovascular:     Rate and Rhythm: Normal rate and regular rhythm.     Pulses: Normal pulses.     Heart sounds: Normal heart sounds.  Pulmonary:     Effort: Pulmonary effort is normal.     Breath sounds: Normal breath sounds.  Chest:     Breasts:        Right: Normal. No swelling, bleeding, inverted nipple, mass or nipple discharge.        Left: Normal. No swelling, bleeding, inverted nipple, mass or nipple discharge.  Abdominal:     General: Abdomen is flat. Bowel sounds are normal.     Palpations: Abdomen is soft.  Genitourinary:    Comments: Deferred, masked Musculoskeletal:        General: Normal range of motion.     Cervical back: Normal range of motion and neck supple.  Skin:    General: Skin is warm.  Neurological:     General: No focal deficit present.     Mental Status: He is alert.  Psychiatric:        Mood and Affect: Mood normal.        Behavior: Behavior normal.         Assessment And Plan:    1. Routine general medical examination at health care facility Comments: A full exam was performed. DRE deferred, as per patient request. PATIENT IS ADVISED TO GET 30-45 MINUTES REGULAR EXERCISE NO LESS THAN FOUR TO FIVE DAYS PER WEEK - BOTH WEIGHTBEARING EXERCISES AND AEROBIC ARE RECOMMENDED.  PATIENT IS ADVISED TO FOLLOW A HEALTHY DIET WITH AT LEAST SIX FRUITS/VEGGIES PER DAY, DECREASE INTAKE OF RED MEAT, AND TO INCREASE FISH INTAKE TO TWO DAYS PER WEEK.  MEATS/FISH SHOULD NOT BE FRIED, BAKED OR BROILED IS PREFERABLE.  I SUGGEST WEARING SPF 50  SUNSCREEN ON EXPOSED PARTS AND ESPECIALLY WHEN IN THE DIRECT SUNLIGHT FOR AN EXTENDED PERIOD OF TIME.  PLEASE AVOID FAST FOOD RESTAURANTS AND INCREASE YOUR WATER INTAKE.  - CBC - CMP14+EGFR - Lipid panel - Hepatitis C antibody - PSA - HIV Antibody (routine testing w rflx)  2. Anxiety Comments: We discussed use of rx meds to help with this. He was  given rx escitalopram, 97m to take once daily in the evenings. Possible side effects discussed with the patient. He may also benefit from magnesium supplementation. He agrees to rto in 4-6 weeks for re-evaluation.  - escitalopram (LEXAPRO) 10 MG tablet; Take 1 tablet (10 mg total) by mouth daily.  Dispense: 30 tablet; Refill: 2  3. Vitamin D deficiency Comments: I will check vitamin D level and supplement as needed.  - VITAMIN D 25 Hydroxy (Vit-D Deficiency, Fractures)  4. Drug therapy Comments: He has rx sedative hypnotics. I will check UDS today.  - Urine Drug Panel 7   Patient was given opportunity to ask questions. Patient verbalized understanding of the plan and was able to repeat key elements of the plan. All questions were answered to their satisfaction.   RMaximino Greenland MD   I, RMaximino Greenland MD, have reviewed all documentation for this visit. The documentation on 10/02/19 for the exam, diagnosis, procedures, and orders are all accurate and complete.  THE PATIENT IS ENCOURAGED TO PRACTICE SOCIAL DISTANCING DUE TO THE COVID-19 PANDEMIC.

## 2019-09-30 LAB — URINE DRUG PANEL 7
Amphetamines, Urine: NEGATIVE ng/mL
Barbiturate Quant, Ur: NEGATIVE ng/mL
Benzodiazepine Quant, Ur: NEGATIVE ng/mL
Cannabinoid Quant, Ur: NEGATIVE ng/mL
Cocaine (Metab.): NEGATIVE ng/mL
Opiate Quant, Ur: NEGATIVE ng/mL
PCP Quant, Ur: NEGATIVE ng/mL

## 2019-09-30 LAB — CMP14+EGFR
ALT: 114 IU/L — ABNORMAL HIGH (ref 0–44)
AST: 84 IU/L — ABNORMAL HIGH (ref 0–40)
Albumin/Globulin Ratio: 2.8 — ABNORMAL HIGH (ref 1.2–2.2)
Albumin: 4.8 g/dL (ref 4.0–5.0)
Alkaline Phosphatase: 73 IU/L (ref 48–121)
BUN/Creatinine Ratio: 12 (ref 9–20)
BUN: 14 mg/dL (ref 6–24)
Bilirubin Total: 0.7 mg/dL (ref 0.0–1.2)
CO2: 25 mmol/L (ref 20–29)
Calcium: 9.8 mg/dL (ref 8.7–10.2)
Chloride: 103 mmol/L (ref 96–106)
Creatinine, Ser: 1.16 mg/dL (ref 0.76–1.27)
GFR calc Af Amer: 86 mL/min/{1.73_m2} (ref >59)
GFR calc non Af Amer: 75 mL/min/{1.73_m2} (ref >59)
Globulin, Total: 1.7 g/dL (ref 1.5–4.5)
Glucose: 95 mg/dL (ref 65–99)
Potassium: 4.2 mmol/L (ref 3.5–5.2)
Sodium: 144 mmol/L (ref 134–144)
Total Protein: 6.5 g/dL (ref 6.0–8.5)

## 2019-09-30 LAB — HIV ANTIBODY (ROUTINE TESTING W REFLEX): HIV Screen 4th Generation wRfx: NONREACTIVE

## 2019-09-30 LAB — LIPID PANEL
Chol/HDL Ratio: 1.9 ratio (ref 0.0–5.0)
Cholesterol, Total: 142 mg/dL (ref 100–199)
HDL: 74 mg/dL (ref >39)
LDL Chol Calc (NIH): 58 mg/dL (ref 0–99)
Triglycerides: 45 mg/dL (ref 0–149)
VLDL Cholesterol Cal: 10 mg/dL (ref 5–40)

## 2019-09-30 LAB — CBC
Hematocrit: 47.1 % (ref 37.5–51.0)
Hemoglobin: 15.9 g/dL (ref 13.0–17.7)
MCH: 31.5 pg (ref 26.6–33.0)
MCHC: 33.8 g/dL (ref 31.5–35.7)
MCV: 93 fL (ref 79–97)
Platelets: 192 10*3/uL (ref 150–450)
RBC: 5.05 x10E6/uL (ref 4.14–5.80)
RDW: 12.6 % (ref 11.6–15.4)
WBC: 3.7 10*3/uL (ref 3.4–10.8)

## 2019-09-30 LAB — VITAMIN D 25 HYDROXY (VIT D DEFICIENCY, FRACTURES): Vit D, 25-Hydroxy: 54.7 ng/mL (ref 30.0–100.0)

## 2019-09-30 LAB — PSA: Prostate Specific Ag, Serum: 0.6 ng/mL (ref 0.0–4.0)

## 2019-09-30 LAB — HEPATITIS C ANTIBODY: Hep C Virus Ab: <0.1 s/co ratio (ref 0.0–0.9)

## 2019-10-02 DIAGNOSIS — F419 Anxiety disorder, unspecified: Secondary | ICD-10-CM | POA: Insufficient documentation

## 2019-11-04 ENCOUNTER — Other Ambulatory Visit: Payer: Self-pay

## 2019-11-04 ENCOUNTER — Other Ambulatory Visit: Payer: Managed Care, Other (non HMO)

## 2019-11-04 DIAGNOSIS — Z79899 Other long term (current) drug therapy: Secondary | ICD-10-CM

## 2019-11-05 LAB — HEPATIC FUNCTION PANEL
ALT: 37 IU/L (ref 0–44)
AST: 30 IU/L (ref 0–40)
Albumin: 4.8 g/dL (ref 4.0–5.0)
Alkaline Phosphatase: 75 IU/L (ref 44–121)
Bilirubin Total: 0.6 mg/dL (ref 0.0–1.2)
Bilirubin, Direct: 0.18 mg/dL (ref 0.00–0.40)
Total Protein: 6.7 g/dL (ref 6.0–8.5)

## 2020-01-27 ENCOUNTER — Other Ambulatory Visit: Payer: Self-pay | Admitting: Internal Medicine

## 2020-01-27 DIAGNOSIS — F419 Anxiety disorder, unspecified: Secondary | ICD-10-CM

## 2020-02-15 ENCOUNTER — Other Ambulatory Visit: Payer: Self-pay | Admitting: Internal Medicine

## 2020-02-15 ENCOUNTER — Encounter: Payer: Self-pay | Admitting: Internal Medicine

## 2020-02-15 MED ORDER — TEMAZEPAM 15 MG PO CAPS: 15.0000 mg | ORAL_CAPSULE | Freq: Every evening | ORAL | 2 refills | Status: DC | PRN

## 2020-04-05 ENCOUNTER — Encounter: Payer: Self-pay | Admitting: Internal Medicine

## 2020-04-05 ENCOUNTER — Ambulatory Visit: Payer: 59 | Admitting: Internal Medicine

## 2020-04-05 ENCOUNTER — Other Ambulatory Visit: Payer: Self-pay

## 2020-04-05 VITALS — BP 114/70 | HR 55 | Temp 97.6°F | Ht 71.2 in | Wt 203.4 lb

## 2020-04-05 DIAGNOSIS — F419 Anxiety disorder, unspecified: Secondary | ICD-10-CM

## 2020-04-05 DIAGNOSIS — Z1211 Encounter for screening for malignant neoplasm of colon: Secondary | ICD-10-CM

## 2020-04-05 DIAGNOSIS — E7800 Pure hypercholesterolemia, unspecified: Secondary | ICD-10-CM

## 2020-04-05 DIAGNOSIS — F5101 Primary insomnia: Secondary | ICD-10-CM

## 2020-04-05 DIAGNOSIS — Z111 Encounter for screening for respiratory tuberculosis: Secondary | ICD-10-CM

## 2020-04-05 MED ORDER — TEMAZEPAM 15 MG PO CAPS: 15.0000 mg | ORAL_CAPSULE | Freq: Every evening | ORAL | 2 refills | Status: DC | PRN

## 2020-04-05 MED ORDER — ESCITALOPRAM OXALATE 10 MG PO TABS: ORAL_TABLET | ORAL | 2 refills | Status: DC

## 2020-04-05 NOTE — Patient Instructions (Signed)
High Cholesterol  High cholesterol is a condition in which the blood has high levels of a white, waxy substance similar to fat (cholesterol). The liver makes all the cholesterol that the body needs. The human body needs small amounts of cholesterol to help build cells. A person gets extra or excess cholesterol from the food that he or she eats. The blood carries cholesterol from the liver to the rest of the body. If you have high cholesterol, deposits (plaques) may build up on the walls of your arteries. Arteries are the blood vessels that carry blood away from your heart. These plaques make the arteries narrow and stiff. Cholesterol plaques increase your risk for heart attack and stroke. Work with your health care provider to keep your cholesterol levels in a healthy range. What increases the risk? The following factors may make you more likely to develop this condition:  Eating foods that are high in animal fat (saturated fat) or cholesterol.  Being overweight.  Not getting enough exercise.  A family history of high cholesterol (familial hypercholesterolemia).  Use of tobacco products.  Having diabetes. What are the signs or symptoms? There are no symptoms of this condition. How is this diagnosed? This condition may be diagnosed based on the results of a blood test.  If you are older than 48 years of age, your health care provider may check your cholesterol levels every 4-6 years.  You may be checked more often if you have high cholesterol or other risk factors for heart disease. The blood test for cholesterol measures:  "Bad" cholesterol, or LDL cholesterol. This is the main type of cholesterol that causes heart disease. The desired level is less than 100 mg/dL.  "Good" cholesterol, or HDL cholesterol. HDL helps protect against heart disease by cleaning the arteries and carrying the LDL to the liver for processing. The desired level for HDL is 60 mg/dL or higher.  Triglycerides.  These are fats that your body can store or burn for energy. The desired level is less than 150 mg/dL.  Total cholesterol. This measures the total amount of cholesterol in your blood and includes LDL, HDL, and triglycerides. The desired level is less than 200 mg/dL. How is this treated? This condition may be treated with:  Diet changes. You may be asked to eat foods that have more fiber and less saturated fats or added sugar.  Lifestyle changes. These may include regular exercise, maintaining a healthy weight, and quitting use of tobacco products.  Medicines. These are given when diet and lifestyle changes have not worked. You may be prescribed a statin medicine to help lower your cholesterol levels. Follow these instructions at home: Eating and drinking  Eat a healthy, balanced diet. This diet includes: ? Daily servings of a variety of fresh, frozen, or canned fruits and vegetables. ? Daily servings of whole grain foods that are rich in fiber. ? Foods that are low in saturated fats and trans fats. These include poultry and fish without skin, lean cuts of meat, and low-fat dairy products. ? A variety of fish, especially oily fish that contain omega-3 fatty acids. Aim to eat fish at least 2 times a week.  Avoid foods and drinks that have added sugar.  Use healthy cooking methods, such as roasting, grilling, broiling, baking, poaching, steaming, and stir-frying. Do not fry your food except for stir-frying.   Lifestyle  Get regular exercise. Aim to exercise for a total of 150 minutes a week. Increase your activity level by doing activities   such as gardening, walking, and taking the stairs.  Do not use any products that contain nicotine or tobacco, such as cigarettes, e-cigarettes, and chewing tobacco. If you need help quitting, ask your health care provider.   General instructions  Take over-the-counter and prescription medicines only as told by your health care provider.  Keep all  follow-up visits as told by your health care provider. This is important. Where to find more information  American Heart Association: www.heart.org  National Heart, Lung, and Blood Institute: www.nhlbi.nih.gov Contact a health care provider if:  You have trouble achieving or maintaining a healthy diet or weight.  You are starting an exercise program.  You are unable to stop smoking. Get help right away if:  You have chest pain.  You have trouble breathing.  You have any symptoms of a stroke. "BE FAST" is an easy way to remember the main warning signs of a stroke: ? B - Balance. Signs are dizziness, sudden trouble walking, or loss of balance. ? E - Eyes. Signs are trouble seeing or a sudden change in vision. ? F - Face. Signs are sudden weakness or numbness of the face, or the face or eyelid drooping on one side. ? A - Arms. Signs are weakness or numbness in an arm. This happens suddenly and usually on one side of the body. ? S - Speech. Signs are sudden trouble speaking, slurred speech, or trouble understanding what people say. ? T - Time. Time to call emergency services. Write down what time symptoms started.  You have other signs of a stroke, such as: ? A sudden, severe headache with no known cause. ? Nausea or vomiting. ? Seizure. These symptoms may represent a serious problem that is an emergency. Do not wait to see if the symptoms will go away. Get medical help right away. Call your local emergency services (911 in the [place] Do not drive yourself to the hospital. Summary  Cholesterol plaques increase your risk for heart attack and stroke. Work with your health care provider to keep your cholesterol levels in a healthy range.  Eat a healthy, balanced diet, get regular exercise, and maintain a healthy weight.  Do not use any products that contain nicotine or tobacco, such as cigarettes, e-cigarettes, and chewing tobacco.  Get help right away if you have any symptoms of a  stroke. This information is not intended to replace advice given to you by your health care provider. Make sure you discuss any questions you have with your health care provider. Document Revised: 12/21/2018 Document Reviewed: 12/21/2018 Elsevier Patient Education  2021 [employer]  

## 2020-04-05 NOTE — Progress Notes (Signed)
I,Katawbba Wiggins,acting as a Education administrator for Maximino Greenland, MD.,have documented all relevant documentation on the behalf of Maximino Greenland, MD,as directed by  Maximino Greenland, MD while in the presence of Maximino Greenland, MD.  This visit occurred during the SARS-CoV-2 public health emergency.  Safety protocols were in place, including screening questions prior to the visit, additional usage of staff PPE, and extensive cleaning of exam room while observing appropriate contact time as indicated for disinfecting solutions.  Subjective:     Patient ID: Curtis [name] , male    DOB: Apr 12, 1970 , 50 y.o.   MRN: [id number]   Chief Complaint  Patient presents with  . Hyperlipidemia    HPI  The patient is here today for a cholesterol follow-up.  He reports compliance with rosuvastatin. He has not had any issues with the medication. He exercises regularly. He also goes to [employer] for regular stretching. Hyperlipidemia This is a chronic problem. The problem is controlled. Pertinent negatives include no leg pain. Current antihyperlipidemic treatment includes statins. The current treatment provides moderate improvement of lipids.  Insomnia Primary symptoms: fragmented sleep, frequent awakening.  The current episode started more than one year. The problem occurs nightly. The problem has been gradually improving since onset. The symptoms are aggravated by anxiety. How many beverages per day that contain caffeine: 0 - 1.  Typical bedtime:  10-11 P.M..  PMH includes: work related stressors. Prior diagnostic workup includes:  Polysomnogram.     Past Medical History:  Diagnosis Date  . Acne   . Allergic rhinitis   . GERD (gastroesophageal reflux disease)   . Insomnia   . Olecranon bursitis   . Pure hypercholesterolemia   . Vitamin D deficiency      Family History  Problem Relation Age of Onset  . Healthy Mother   . Hypertension Father   . High Cholesterol Father   . Healthy Brother       Current Outpatient Medications:  .  Cholecalciferol (VITAMIN D) 2000 units CAPS, Take 1 capsule by mouth daily., Disp: , Rfl:  .  levocetirizine (XYZAL) 5 MG tablet, Take 1 tablet (5 mg total) by mouth every evening., Disp: 90 tablet, Rfl: 2 .  MELATONIN PO, Take 15 mg by mouth at bedtime as needed., Disp: , Rfl:  .  meloxicam (MOBIC) 15 MG tablet, TAKE 1 TABLET(15 MG) BY MOUTH DAILY AS NEEDED FOR PAIN, Disp: 30 tablet, Rfl: 1 .  Omega-3 Fatty Acids (FISH OIL) 500 MG CAPS, Take 1 capsule by mouth daily., Disp: , Rfl:  .  rosuvastatin (CRESTOR) 5 MG tablet, Take 1 tablet (5 mg total) by mouth daily. (Patient taking differently: Take 5 mg by mouth daily. Monday, Wednesday, Friday), Disp: 90 tablet, Rfl: 3 .  cyclobenzaprine (AMRIX) 15 MG 24 hr capsule, Take 1 capsule (15 mg total) by mouth daily as needed for muscle spasms. (Patient not taking: Reported on 04/05/2020), Disp: 30 capsule, Rfl: 0 .  escitalopram (LEXAPRO) 10 MG tablet, TAKE 1 TABLET(10 MG) BY MOUTH DAILY, Disp: 90 tablet, Rfl: 2 .  temazepam (RESTORIL) 15 MG capsule, Take 1 capsule (15 mg total) by mouth at bedtime as needed for sleep., Disp: 60 capsule, Rfl: 2   No Known Allergies   Review of Systems  Constitutional: Negative.   Respiratory: Negative.   Cardiovascular: Negative.   Gastrointestinal: Negative.   Psychiatric/Behavioral: The patient has insomnia.   All other systems reviewed and are negative.    Today's Vitals   04/05/20 4098  BP: 114/70  Pulse: (!) 55  Temp: 97.6 F (36.4 C)  TempSrc: Oral  Weight: 203 lb 6.4 oz (92.3 kg)  Height: 5' 11.2" (1.808 m)   Body mass index is 28.21 kg/m.  Wt Readings from Last 3 Encounters:  04/05/20 203 lb 6.4 oz (92.3 kg)  09/29/19 195 lb 3.2 oz (88.5 kg)  08/23/19 198 lb (89.8 kg)   Objective:  Physical Exam Vitals and nursing note reviewed.  Constitutional:      Appearance: Normal appearance.  HENT:     Head: Normocephalic and atraumatic.     Nose:      Comments: Masked     Mouth/Throat:     Comments: Masked  Cardiovascular:     Rate and Rhythm: Normal rate and regular rhythm.     Heart sounds: Normal heart sounds.  Pulmonary:     Breath sounds: Normal breath sounds.  Musculoskeletal:     Cervical back: Normal range of motion.  Skin:    General: Skin is warm.  Neurological:     General: No focal deficit present.     Mental Status: He is alert and oriented to person, place, and time.         Assessment And Plan:     1. Pure hypercholesterolemia Comments: Chronic, he will c/w rosuvastatin.  He is encouraged to follow a heart healthy lifesyle.  I will check liver function today. He will f/u in 6 months for CPE.  - CMP14+EGFR  2. Primary insomnia Comments: Chronic, I will send refill of temazepm to his mail order pharmacy. He is encouraged to have a bedtime routine.   3. Anxiety Comments: Chronic. He will c/w escitalopram. He may also benefit from magnesium/l-theanine supplementation.   4. Screen for colon cancer Comments: He agrees to GI referrral for CRC screening.  - Ambulatory referral to Gastroenterology  5. Screening-pulmonary TB Comments: I will check quantiferon TB-gold today. - QuantiFERON-TB Gold Plus   Patient was given opportunity to ask questions. Patient verbalized understanding of the plan and was able to repeat key elements of the plan. All questions were answered to their satisfaction.   I, Maximino Greenland, MD, have reviewed all documentation for this visit. The documentation on 04/09/20 for the exam, diagnosis, procedures, and orders are all accurate and complete.  THE PATIENT IS ENCOURAGED TO PRACTICE SOCIAL DISTANCING DUE TO THE COVID-19 PANDEMIC.

## 2020-04-08 LAB — CMP14+EGFR
ALT: 35 IU/L (ref 0–44)
AST: 33 IU/L (ref 0–40)
Albumin/Globulin Ratio: 2.5 — ABNORMAL HIGH (ref 1.2–2.2)
Albumin: 5 g/dL (ref 4.0–5.0)
Alkaline Phosphatase: 72 IU/L (ref 44–121)
BUN/Creatinine Ratio: 11 (ref 9–20)
BUN: 12 mg/dL (ref 6–24)
Bilirubin Total: 0.5 mg/dL (ref 0.0–1.2)
CO2: 26 mmol/L (ref 20–29)
Calcium: 9.8 mg/dL (ref 8.7–10.2)
Chloride: 102 mmol/L (ref 96–106)
Creatinine, Ser: 1.07 mg/dL (ref 0.76–1.27)
Globulin, Total: 2 g/dL (ref 1.5–4.5)
Glucose: 85 mg/dL (ref 65–99)
Potassium: 4.9 mmol/L (ref 3.5–5.2)
Sodium: 142 mmol/L (ref 134–144)
Total Protein: 7 g/dL (ref 6.0–8.5)
eGFR: 86 mL/min/{1.73_m2} (ref >59)

## 2020-04-08 LAB — QUANTIFERON-TB GOLD PLUS
QuantiFERON Mitogen Value: >10 IU/mL
QuantiFERON Nil Value: 0.03 IU/mL
QuantiFERON TB1 Ag Value: 0.03 IU/mL
QuantiFERON TB2 Ag Value: 0.03 IU/mL
QuantiFERON-TB Gold Plus: NEGATIVE

## 2020-04-19 ENCOUNTER — Encounter: Payer: Self-pay | Admitting: Internal Medicine

## 2020-05-23 LAB — HM COLONOSCOPY

## 2020-06-28 ENCOUNTER — Encounter: Payer: Self-pay | Admitting: Internal Medicine

## 2020-07-19 ENCOUNTER — Encounter: Payer: Self-pay | Admitting: Internal Medicine

## 2020-08-22 ENCOUNTER — Other Ambulatory Visit: Payer: Self-pay

## 2020-08-22 ENCOUNTER — Encounter: Payer: Self-pay | Admitting: Family Medicine

## 2020-08-22 ENCOUNTER — Ambulatory Visit: Payer: 59 | Admitting: Family Medicine

## 2020-08-22 VITALS — BP 131/82 | HR 49 | Ht 74.0 in | Wt 196.0 lb

## 2020-08-22 DIAGNOSIS — G4730 Sleep apnea, unspecified: Secondary | ICD-10-CM

## 2020-08-22 DIAGNOSIS — G4731 Primary central sleep apnea: Secondary | ICD-10-CM

## 2020-08-22 DIAGNOSIS — F5103 Paradoxical insomnia: Secondary | ICD-10-CM | POA: Diagnosis not present

## 2020-08-22 NOTE — Progress Notes (Signed)
PATIENT: Curtis [name] DOB: 1970/12/28  REASON FOR VISIT: follow up HISTORY FROM: patient  Chief Complaint  Patient presents with   Follow-up    Pt alone, rm 1. Yearly for CPAP. No issues or concerns. DME: [facility] ([facility])       HISTORY OF PRESENT ILLNESS: 08/22/20 ALL: Curtis [name] returns for follow up for central sleep apnea on CPAP and insomnia. He was previously taking trazodone 50mg  but switched to temazepam 15mg  by PCP and escitalopram 10mg  daily. He feels that he is doing very well. He is sleeping better. He is using CPAP nightly. He did have an issue with his tubing that prevented usage a couple of nights last month but he usually uses CPAP every night. No concerns with [facility]     08/23/2019 ALL:  Curtis [name] is a 50 y.o. male here today for follow up for central sleep apnea treated with CPAP and insomnia on trazodone 50mg  daily. He reports that he is doing well. He is using CPAP nightly. Trazodone has helped him stay asleep longer. He seems to wake most days around 4-5am. He also takes melatonin every night and occasionally will take temazepam prescribed by PCP. He is feeling well today and without concerns.   Compliance report dated 07/20/2019 through 08/18/2019 reveals that he has used CPAP 29 of the past 30 days for compliance of 97%.  He used CPAP greater than 4 hours 27 of the past 30 days for compliance of 90%.  Average usage was 7 hours and 45 minutes.  Residual AHI was 2.0 on 5 to 9 cm of water and an EPR of 2.  There was no leak noted.  HISTORY: (copied from Dr Dohmeier's note on 02/22/2019)  RV 02-22-2019, I have the pleasure of meeting with 07/22/2019 today, an established complex sleep apnea patient ,who also has insomnia. While his primary central sleep apnea has been controlled he still has difficulties to  sleep through the night. His compliance on his air sense 10 machine is excellent he uses a very small pressure window between 5 and 9 cmH2O this 2 cm  expiratory pressure relief, average use at time 6 hours 25 minutes and he has a 93% compliance by days.  His residual AHI is 2.3 and the residual apneas are mostly central at 1.5/h however this is a good resolution of apnea.  She endorsed the fatigue severity scale 24 points out of 63.  He endorsed the Epworth sleepiness score at only 4 out of 24 points. He goes to sleep around 10 10:30 PM but he has problems with waking up early at around 4 AM but he does not need to get up and it is not desired to get up.  However there are also days and weeks where he can catch 7 hours of sleep alternating with days and nights of 5 or 5-1/2 hours of sleep.  The early morning awakening may be related to low serotonin levels.  I will be happy to discuss cyclic sleep disorders here today.   02-18-2018,  RV after 2 sleep studies.  Curtis [name] is a 50 y.o. caucasian, right handed  male patient and was seen in a referral from Dr. [name] for a sleep medicine consultation.  Curtis [name] underwent a baseline polysomnography with an expanded EEG montage of 29 September 2017 which ended up in a surprising result he had by far dominating central apneas of obstructive apneas.  58 central apneas were seen 10 obstructive 7 2 mixed as  well as 44 hypopneas.  The total AHI was 17.5 in the mild-to-moderate range of the RDI was not higher.  During REM sleep there was a less apnea which is a typical distribution for a central apnea patient.  In supine sleep there were 28.5 versus nonsupine 6.0/h.  He also had sinus bradycardia which can be also related to his athletic activities.  Primary snoring was noted and be due to the central apnea nature we asked him to return for an attended CPAP titration which took place on 7 October.  The AHI was reduced to 5.2 overall at a final pressure of 8 cmH2O there was a reduction of 0.0 AHI and 100% sleep efficiency.  The technologist gave the patient a Simplus mask which is a fullface model in large size.  He is  now using an auto titration CPAP with a pressure range between 5 and 9 cmH2O and one 1 cm EPR his compliance was 97% by days and 25 of those days were over 4 hours of daily use with an average user time of 6 hours and 8 minutes.  The residual AHI is 5.0 and the residual apneas are still central in nature.  However we manage not to provoke more central apneas.   He feels as if he is still not sleeping super long, but his sleep has improved, his sleep quality is more restorative. Bradycardia. He trains 4 days a week, cardio and weight lifting.    Epworth changed on CPAP to 2 points and FSS to 13 points.      HPI: Chief complaint according to patient : "Increasingly fatigued, and sleepy. I have wake up early and can't go back to sleep ". He works for [employer] as a Psychologist, counselling. Curtis [name] reports that he has noted certain cycles or phases of sleep changes.  There will be times where he wakes up at the exact time every morning for example 3; 40 minutes AM and after a couple of weeks or so this again will change. It has never been difficult for him to go to sleep in the first place but if he goes to early he also wakes up early.  He craves to be able to sleep 7 or 8 hours but this has rarely happened. He hasn't slept in daytime , has never been a "napper".   Sleep habits are as follows: The patient aims for a bedtime between 10 and 11 PM, if he would go to sleep earlier he would wake up earlier and he always avoids going to sleep later than midnight. He feels pretty good at this routine. Bedroom is cool , quiet and dark. He shares the bedroom with his wife, but he feels usually better when alone in bed. He sleeps poorly in hotels.  Kids are 8 and 11, no pets in the bedroom. Bathroom break will wake him up some days, mostly around 3 or 4 AM.  He has vivid dreams- but feels these don't wake him, no pain or discomfort that interrupts his sleep. Used to have some low back pain that has resolved.   No enactment, no  sleep . Wife reports snoring, mildly and more after alcohol ( weekends ) and only on his back.  He wakes at 6. 30 AM spontaneously. He is always able to wake without alarm. He feels refreshed some days, only after at least 6 hours of sleep. Often he is fatigued.    Sleep medical history and family sleep history: father was a  snorer, lost weight- CPAP user. Mother is a Agricultural consultantlight sleeper. One brother who is  younger by 3.5 years sleeps well.     Social history: he practices good sleep hygiene. He doesn't have electronics in the bedroom- married, 2 children in school age. Sales rep , travels by car.  Non smoker, exerciser, ETOH- wine or beer on weekends, 2-3 beers per WE evening, caffeine - no soda, 2 cups of coffee in AM, sweet tea at lunch.      REVIEW OF SYSTEMS: Out of a complete 14 system review of symptoms, the patient complains only of the following symptoms, insomnia and all other reviewed systems are negative.  ESS: 1 FSS: 9   ALLERGIES: No Known Allergies  HOME MEDICATIONS: Outpatient Medications Prior to Visit  Medication Sig Dispense Refill   Cholecalciferol (VITAMIN D) 2000 units CAPS Take 1 capsule by mouth daily.     cyclobenzaprine (AMRIX) 15 MG 24 hr capsule Take 1 capsule (15 mg total) by mouth daily as needed for muscle spasms. 30 capsule 0   escitalopram (LEXAPRO) 10 MG tablet TAKE 1 TABLET(10 MG) BY MOUTH DAILY 90 tablet 2   meloxicam (MOBIC) 15 MG tablet TAKE 1 TABLET(15 MG) BY MOUTH DAILY AS NEEDED FOR PAIN 30 tablet 1   Omega-3 Fatty Acids (FISH OIL) 500 MG CAPS Take 1 capsule by mouth daily.     rosuvastatin (CRESTOR) 5 MG tablet Take 1 tablet (5 mg total) by mouth daily. (Patient taking differently: Take 5 mg by mouth daily. Monday, Wednesday, Friday) 90 tablet 3   temazepam (RESTORIL) 15 MG capsule Take 1 capsule (15 mg total) by mouth at bedtime as needed for sleep. 60 capsule 2   levocetirizine (XYZAL) 5 MG tablet Take 1 tablet (5 mg total) by mouth every evening. 90  tablet 2   MELATONIN PO Take 15 mg by mouth at bedtime as needed.     No facility-administered medications prior to visit.    PAST MEDICAL HISTORY: Past Medical History:  Diagnosis Date   Acne    Allergic rhinitis    GERD (gastroesophageal reflux disease)    Insomnia    Olecranon bursitis    Pure hypercholesterolemia    Vitamin D deficiency     PAST SURGICAL HISTORY: Past Surgical History:  Procedure Laterality Date   INGUINAL HERNIA REPAIR Right    WISDOM TOOTH EXTRACTION      FAMILY HISTORY: Family History  Problem Relation Age of Onset   Healthy Mother    Hypertension Father    High Cholesterol Father    Healthy Brother     SOCIAL HISTORY: Social History   Socioeconomic History   Marital status: Married    Spouse name: Not on file   Number of children: Not on file   Years of education: Not on file   Highest education level: Not on file  Occupational History   Not on file  Tobacco Use   Smoking status: Never   Smokeless tobacco: Never  Vaping Use   Vaping Use: Never used  Substance and Sexual Activity   Alcohol use: Yes    Alcohol/week: 6.0 standard drinks    Types: 6 Glasses of wine per week   Drug use: Never   Sexual activity: Not on file  Other Topics Concern   Not on file  Social History Narrative   Not on file   Social Determinants of Health   Financial Resource Strain: Not on file  Food Insecurity: Not on file  Transportation  Needs: Not on file  Physical Activity: Not on file  Stress: Not on file  Social Connections: Not on file  Intimate Partner Violence: Not on file      PHYSICAL EXAM  Vitals:   08/22/20 0933  BP: 131/82  Pulse: (!) 49  Weight: 196 lb (88.9 kg)  Height: 6\' 2"  (1.88 m)    Body mass index is 25.16 kg/m.  Generalized: Well developed, in no acute distress  Cardiology: normal rate and rhythm, no murmur noted Respiratory: clear to auscultation bilaterally  Neurological examination  Mentation: Alert  oriented to time, place, history taking. Follows all commands speech and language fluent Cranial nerve II-XII: Pupils were equal round reactive to light. Extraocular movements were full, visual field were full  Motor: The motor testing reveals 5 over 5 strength of all 4 extremities. Good symmetric motor tone is noted throughout.   Gait and station: Gait is normal.   DIAGNOSTIC DATA (LABS, IMAGING, TESTING) - I reviewed patient records, labs, notes, testing and imaging myself where available.  No flowsheet data found.   Lab Results  Component Value Date   WBC 3.7 09/29/2019   HGB 15.9 09/29/2019   HCT 47.1 09/29/2019   MCV 93 09/29/2019   PLT 192 09/29/2019      Component Value Date/Time   NA 142 04/05/2020 1033   K 4.9 04/05/2020 1033   CL 102 04/05/2020 1033   CO2 26 04/05/2020 1033   GLUCOSE 85 04/05/2020 1033   BUN 12 04/05/2020 1033   CREATININE 1.07 04/05/2020 1033   CALCIUM 9.8 04/05/2020 1033   PROT 7.0 04/05/2020 1033   ALBUMIN 5.0 04/05/2020 1033   AST 33 04/05/2020 1033   ALT 35 04/05/2020 1033   ALKPHOS 72 04/05/2020 1033   BILITOT 0.5 04/05/2020 1033   GFRNONAA 75 09/29/2019 0948   GFRAA 86 09/29/2019 0948   Lab Results  Component Value Date   CHOL 142 09/29/2019   HDL 74 09/29/2019   LDLCALC 58 09/29/2019   TRIG 45 09/29/2019   CHOLHDL 1.9 09/29/2019   Lab Results  Component Value Date   HGBA1C 5.1 09/07/2018   No results found for: VITAMINB12 No results found for: TSH     ASSESSMENT AND PLAN 50 y.o. year old male  has a past medical history of Acne, Allergic rhinitis, GERD (gastroesophageal reflux disease), Insomnia, Olecranon bursitis, Pure hypercholesterolemia, and Vitamin D deficiency. here with     ICD-10-CM   1. 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 Sleep apnea with use of continuous positive airway pressure (CPAP)   G47.30 For home use only DME continuous positive airway pressure (CPAP)       Brey is doing well on CPAP therapy.  Compliance report reveals excellent compliance.  He was encouraged to continue using CPAP nightly and for greater than 4 hours each night. Insomnia is much better on temazepam 15mg  and escitalopram 10mg  at bedtime, managed by PCP. We will continue this medication. Healthy lifestyle habits encouraged.  He will follow-up with 52 in 1 year, sooner if needed.  He verbalizes understanding and agreement with this plan.   Orders Placed This Encounter  Procedures   For home use only DME continuous positive airway pressure (CPAP)    Supplies    Order Specific Question:   Length of Need    Answer:   Lifetime  Order Specific Question:   Patient has OSA or probable OSA    Answer:   Yes    Order Specific Question:   Is the patient currently using CPAP in the home    Answer:   Yes    Order Specific Question:   Settings    Answer:   Other see comments    Order Specific Question:   CPAP supplies needed    Answer:   Mask, headgear, cushions, filters, heated tubing and water chamber      No orders of the defined types were placed in this encounter.     Shawnie Dapper, FNP-C 08/22/2020, 9:48 AM [facility] [address], [address] [place], [place] [address] [phone]

## 2020-08-22 NOTE — Patient Instructions (Signed)

## 2020-09-17 ENCOUNTER — Encounter: Payer: Self-pay | Admitting: Internal Medicine

## 2020-09-18 ENCOUNTER — Other Ambulatory Visit: Payer: Self-pay

## 2020-09-18 MED ORDER — MELOXICAM 15 MG PO TABS: ORAL_TABLET | ORAL | 0 refills | Status: DC

## 2020-09-18 MED ORDER — ROSUVASTATIN CALCIUM 5 MG PO TABS: 5.0000 mg | ORAL_TABLET | Freq: Every day | ORAL | 3 refills | Status: DC

## 2020-10-10 ENCOUNTER — Ambulatory Visit (INDEPENDENT_AMBULATORY_CARE_PROVIDER_SITE_OTHER): Payer: 59 | Admitting: Internal Medicine

## 2020-10-10 ENCOUNTER — Other Ambulatory Visit: Payer: Self-pay

## 2020-10-10 ENCOUNTER — Encounter: Payer: Self-pay | Admitting: Internal Medicine

## 2020-10-10 VITALS — BP 112/66 | HR 55 | Temp 98.1°F | Ht 74.0 in | Wt 200.8 lb

## 2020-10-10 DIAGNOSIS — Z0000 Encounter for general adult medical examination without abnormal findings: Secondary | ICD-10-CM

## 2020-10-10 NOTE — Progress Notes (Signed)
I,Tianna Badgett,acting as a Education administrator for Maximino Greenland, MD.,have documented all relevant documentation on the behalf of Maximino Greenland, MD,as directed by  Maximino Greenland, MD while in the presence of Maximino Greenland, MD.  This visit occurred during the SARS-CoV-2 public health emergency.  Safety protocols were in place, including screening questions prior to the visit, additional usage of staff PPE, and extensive cleaning of exam room while observing appropriate contact time as indicated for disinfecting solutions.  Subjective:     Patient ID: Curtis [name] , male    DOB: [birth year] , 50 y.o.   MRN: [id number]   Chief Complaint  Patient presents with   Annual Exam    HPI  He is here today for a full physical examination. He has no specific concerns or complaints at this time. He reports compliance with meds. Does not have any concerns regarding meds.     Past Medical History:  Diagnosis Date   Acne    Allergic rhinitis    GERD (gastroesophageal reflux disease)    Insomnia    Olecranon bursitis    Pure hypercholesterolemia    Vitamin D deficiency      Family History  Problem Relation Age of Onset   Healthy Mother    Hypertension Father    High Cholesterol Father    Healthy Brother      Current Outpatient Medications:    Cholecalciferol (VITAMIN D) 2000 units CAPS, Take 1 capsule by mouth daily., Disp: , Rfl:    escitalopram (LEXAPRO) 10 MG tablet, TAKE 1 TABLET(10 MG) BY MOUTH DAILY, Disp: 90 tablet, Rfl: 2   meloxicam (MOBIC) 15 MG tablet, TAKE 1 TABLET(15 MG) BY MOUTH DAILY AS NEEDED FOR PAIN, Disp: 60 tablet, Rfl: 0   Omega-3 Fatty Acids (FISH OIL) 500 MG CAPS, Take 1 capsule by mouth daily., Disp: , Rfl:    rosuvastatin (CRESTOR) 5 MG tablet, Take 1 tablet (5 mg total) by mouth daily., Disp: 90 tablet, Rfl: 3   temazepam (RESTORIL) 15 MG capsule, Take 1 capsule (15 mg total) by mouth at bedtime as needed for sleep., Disp: 60 capsule, Rfl: 2   cyclobenzaprine (AMRIX)  15 MG 24 hr capsule, Take 1 capsule (15 mg total) by mouth daily as needed for muscle spasms. (Patient not taking: Reported on 10/10/2020), Disp: 30 capsule, Rfl: 0   No Known Allergies   Men's preventive visit. Patient Health Questionnaire (PHQ-2) is  [place] Office Visit from 10/10/2020 in [facility]  PHQ-2 Total Score 0     . Patient is on a healthy diet. Marital status: Married. Relevant history for alcohol use is:  Social History   Substance and Sexual Activity  Alcohol Use Yes   Alcohol/week: 6.0 standard drinks   Types: 6 Glasses of wine per week  . Relevant history for tobacco use is:  Social History   Tobacco Use  Smoking Status Never  Smokeless Tobacco Never  .   Review of Systems  Constitutional: Negative.   HENT: Negative.    Eyes: Negative.   Respiratory: Negative.    Cardiovascular: Negative.   Gastrointestinal: Negative.   Endocrine: Negative.   Genitourinary: Negative.   Musculoskeletal: Negative.   Skin: Negative.   Allergic/Immunologic: Negative.   Neurological: Negative.   Hematological: Negative.   Psychiatric/Behavioral: Negative.      Today's Vitals   10/10/20 0855  BP: 112/66  Pulse: (!) 55  Temp: 98.1 F (36.7 C)  TempSrc: Oral  Weight: 200  lb 12.8 oz (91.1 kg)  Height: $Remove'6\' 2"'[id number]$  (1.88 m)   Body mass index is 25.78 kg/m.  Wt Readings from Last 3 Encounters:  10/10/20 200 lb 12.8 oz (91.1 kg)  08/22/20 196 lb (88.9 kg)  04/05/20 203 lb 6.4 oz (92.3 kg)    BP Readings from Last 3 Encounters:  10/10/20 112/66  08/22/20 131/82  04/05/20 114/70    Objective:  Physical Exam Vitals and nursing note reviewed.  Constitutional:      Appearance: Normal appearance.  HENT:     Head: Normocephalic and atraumatic.     Right Ear: Tympanic membrane, ear canal and external ear normal.     Left Ear: Tympanic membrane, ear canal and external ear normal.     Nose:     Comments: Masked     Mouth/Throat:     Comments:  Masked  Eyes:     Extraocular Movements: Extraocular movements intact.     Conjunctiva/sclera: Conjunctivae normal.     Pupils: Pupils are equal, round, and reactive to light.  Cardiovascular:     Rate and Rhythm: Normal rate and regular rhythm.     Pulses: Normal pulses.     Heart sounds: Normal heart sounds.  Pulmonary:     Effort: Pulmonary effort is normal.     Breath sounds: Normal breath sounds.  Chest:  Breasts:    Right: Normal. No swelling, bleeding, inverted nipple, mass or nipple discharge.     Left: Normal. No swelling, bleeding, inverted nipple, mass or nipple discharge.  Abdominal:     General: Bowel sounds are normal.     Palpations: Abdomen is soft.  Genitourinary:    Comments: deferred Musculoskeletal:        General: Normal range of motion.     Cervical back: Normal range of motion and neck supple.  Skin:    General: Skin is warm and dry.  Neurological:     General: No focal deficit present.     Mental Status: He is alert and oriented to person, place, and time.  Psychiatric:        Mood and Affect: Mood normal.        Behavior: Behavior normal.        Assessment And Plan:    1. Routine general medical examination at health care facility Comments: A full exam was performed. DRE deferred as per patient. He would like to see Urology at age 43 for prostate exams. I will check PSA today, along with other labs as listed. WE will defer flu vaccine until next month. PATIENT IS ADVISED TO GET 30-45 MINUTES REGULAR EXERCISE NO LESS THAN FOUR TO FIVE DAYS PER WEEK - BOTH WEIGHTBEARING EXERCISES AND AEROBIC ARE RECOMMENDED.  PATIENT IS ADVISED TO FOLLOW A HEALTHY DIET WITH AT LEAST SIX FRUITS/VEGGIES PER DAY, DECREASE INTAKE OF RED MEAT, AND TO INCREASE FISH INTAKE TO TWO DAYS PER WEEK.  MEATS/FISH SHOULD NOT BE FRIED, BAKED OR BROILED IS PREFERABLE.  IT IS ALSO IMPORTANT TO CUT BACK ON YOUR SUGAR INTAKE. PLEASE AVOID ANYTHING WITH ADDED SUGAR, CORN SYRUP OR OTHER  SWEETENERS. IF YOU MUST USE A SWEETENER, YOU CAN TRY STEVIA. IT IS ALSO IMPORTANT TO AVOID ARTIFICIALLY SWEETENERS AND DIET BEVERAGES. LASTLY, I SUGGEST WEARING SPF 50 SUNSCREEN ON EXPOSED PARTS AND ESPECIALLY WHEN IN THE DIRECT SUNLIGHT FOR AN EXTENDED PERIOD OF TIME.  PLEASE AVOID FAST FOOD RESTAURANTS AND INCREASE YOUR WATER INTAKE.  - CBC - CMP14+EGFR - Lipid panel - PSA - Insulin,  random([phone])     Patient was given opportunity to ask questions. Patient verbalized understanding of the plan and was able to repeat key elements of the plan. All questions were answered to their satisfaction.   I, Maximino Greenland, MD, have reviewed all documentation for this visit. The documentation on 10/10/20 for the exam, diagnosis, procedures, and orders are all accurate and complete.  THE PATIENT IS ENCOURAGED TO PRACTICE SOCIAL DISTANCING DUE TO THE COVID-19 PANDEMIC.

## 2020-10-10 NOTE — Patient Instructions (Signed)
Health Maintenance, Male Adopting a healthy lifestyle and getting preventive care are important in promoting health and wellness. Ask your health care provider about: The right schedule for you to have regular tests and exams. Things you can do on your own to prevent diseases and keep yourself healthy. What should I know about diet, weight, and exercise? Eat a healthy diet  Eat a diet that includes plenty of vegetables, fruits, low-fat dairy products, and lean protein. Do not eat a lot of foods that are high in solid fats, added sugars, or sodium. Maintain a healthy weight Body mass index (BMI) is a measurement that can be used to identify possible weight problems. It estimates body fat based on height and weight. Your health care provider can help determine your BMI and help you achieve or maintain a healthy weight. Get regular exercise Get regular exercise. This is one of the most important things you can do for your health. Most adults should: Exercise for at least 150 minutes each week. The exercise should increase your heart rate and make you sweat (moderate-intensity exercise). Do strengthening exercises at least twice a week. This is in addition to the moderate-intensity exercise. Spend less time sitting. Even light physical activity can be beneficial. Watch cholesterol and blood lipids Have your blood tested for lipids and cholesterol at 48 years of age, then have this test every 5 years. You may need to have your cholesterol levels checked more often if: Your lipid or cholesterol levels are high. You are older than 48 years of age. You are at high risk for heart disease. What should I know about cancer screening? Many types of cancers can be detected early and may often be prevented. Depending on your health history and family history, you may need to have cancer screening at various ages. This may include screening for: Colorectal cancer. Prostate cancer. Skin cancer. Lung  cancer. What should I know about heart disease, diabetes, and high blood pressure? Blood pressure and heart disease High blood pressure causes heart disease and increases the risk of stroke. This is more likely to develop in people who have high blood pressure readings, are of African descent, or are overweight. Talk with your health care provider about your target blood pressure readings. Have your blood pressure checked: Every 3-5 years if you are 18-39 years of age. Every year if you are 40 years old or older. If you are between the ages of 65 and 75 and are a current or former smoker, ask your health care provider if you should have a one-time screening for abdominal aortic aneurysm (AAA). Diabetes Have regular diabetes screenings. This checks your fasting blood sugar level. Have the screening done: Once every three years after age 45 if you are at a normal weight and have a low risk for diabetes. More often and at a younger age if you are overweight or have a high risk for diabetes. What should I know about preventing infection? Hepatitis B If you have a higher risk for hepatitis B, you should be screened for this virus. Talk with your health care provider to find out if you are at risk for hepatitis B infection. Hepatitis C Blood testing is recommended for: Everyone born from 1945 through 1965. Anyone with known risk factors for hepatitis C. Sexually transmitted infections (STIs) You should be screened each year for STIs, including gonorrhea and chlamydia, if: You are sexually active and are younger than 48 years of age. You are older than 48 years   of age and your health care provider tells you that you are at risk for this type of infection. Your sexual activity has changed since you were last screened, and you are at increased risk for chlamydia or gonorrhea. Ask your health care provider if you are at risk. Ask your health care provider about whether you are at high risk for HIV.  Your health care provider may recommend a prescription medicine to help prevent HIV infection. If you choose to take medicine to prevent HIV, you should first get tested for HIV. You should then be tested every 3 months for as long as you are taking the medicine. Follow these instructions at home: Lifestyle Do not use any products that contain nicotine or tobacco, such as cigarettes, e-cigarettes, and chewing tobacco. If you need help quitting, ask your health care provider. Do not use street drugs. Do not share needles. Ask your health care provider for help if you need support or information about quitting drugs. Alcohol use Do not drink alcohol if your health care provider tells you not to drink. If you drink alcohol: Limit how much you have to 0-2 drinks a day. Be aware of how much alcohol is in your drink. In the [place] one drink equals one 12 oz bottle of beer (355 mL), one 5 oz glass of wine (148 mL), or one 1 oz glass of hard liquor (44 mL). General instructions Schedule regular health, dental, and eye exams. Stay current with your vaccines. Tell your health care provider if: You often feel depressed. You have ever been abused or do not feel safe at home. Summary Adopting a healthy lifestyle and getting preventive care are important in promoting health and wellness. Follow your health care provider's instructions about healthy diet, exercising, and getting tested or screened for diseases. Follow your health care provider's instructions on monitoring your cholesterol and blood pressure. This information is not intended to replace advice given to you by your health care provider. Make sure you discuss any questions you have with your health care provider. Document Revised: 03/31/2020 Document Reviewed: 01/14/2018 Elsevier Patient Education  2022 Elsevier Inc.  

## 2020-10-11 LAB — CBC
Hematocrit: 47 % (ref 37.5–51.0)
Hemoglobin: 15.5 g/dL (ref 13.0–17.7)
MCH: 31.3 pg (ref 26.6–33.0)
MCHC: 33 g/dL (ref 31.5–35.7)
MCV: 95 fL (ref 79–97)
Platelets: 224 10*3/uL (ref 150–450)
RBC: 4.95 x10E6/uL (ref 4.14–5.80)
RDW: 12.7 % (ref 11.6–15.4)
WBC: 4.2 10*3/uL (ref 3.4–10.8)

## 2020-10-11 LAB — CMP14+EGFR
ALT: 42 IU/L (ref 0–44)
AST: 39 IU/L (ref 0–40)
Albumin/Globulin Ratio: 2.9 — ABNORMAL HIGH (ref 1.2–2.2)
Albumin: 5 g/dL (ref 4.0–5.0)
Alkaline Phosphatase: 60 IU/L (ref 44–121)
BUN/Creatinine Ratio: 13 (ref 9–20)
BUN: 14 mg/dL (ref 6–24)
Bilirubin Total: 0.4 mg/dL (ref 0.0–1.2)
CO2: 26 mmol/L (ref 20–29)
Calcium: 9.8 mg/dL (ref 8.7–10.2)
Chloride: 102 mmol/L (ref 96–106)
Creatinine, Ser: 1.11 mg/dL (ref 0.76–1.27)
Globulin, Total: 1.7 g/dL (ref 1.5–4.5)
Glucose: 92 mg/dL (ref 65–99)
Potassium: 4.9 mmol/L (ref 3.5–5.2)
Sodium: 147 mmol/L — ABNORMAL HIGH (ref 134–144)
Total Protein: 6.7 g/dL (ref 6.0–8.5)
eGFR: 82 mL/min/{1.73_m2} (ref >59)

## 2020-10-11 LAB — LIPID PANEL
Chol/HDL Ratio: 1.9 ratio (ref 0.0–5.0)
Cholesterol, Total: 149 mg/dL (ref 100–199)
HDL: 78 mg/dL (ref >39)
LDL Chol Calc (NIH): 62 mg/dL (ref 0–99)
Triglycerides: 40 mg/dL (ref 0–149)
VLDL Cholesterol Cal: 9 mg/dL (ref 5–40)

## 2020-10-11 LAB — INSULIN, RANDOM: INSULIN: 4.1 u[IU]/mL (ref 2.6–24.9)

## 2020-10-11 LAB — PSA: Prostate Specific Ag, Serum: 0.5 ng/mL (ref 0.0–4.0)

## 2020-10-17 ENCOUNTER — Encounter: Payer: Self-pay | Admitting: Internal Medicine

## 2020-11-01 ENCOUNTER — Other Ambulatory Visit: Payer: Self-pay | Admitting: Internal Medicine

## 2020-11-01 DIAGNOSIS — F419 Anxiety disorder, unspecified: Secondary | ICD-10-CM

## 2020-11-21 ENCOUNTER — Other Ambulatory Visit: Payer: Self-pay

## 2020-11-21 ENCOUNTER — Ambulatory Visit (INDEPENDENT_AMBULATORY_CARE_PROVIDER_SITE_OTHER): Payer: 59

## 2020-11-21 VITALS — BP 114/82 | HR 61 | Ht 74.0 in

## 2020-11-21 DIAGNOSIS — Z23 Encounter for immunization: Secondary | ICD-10-CM | POA: Diagnosis not present

## 2020-11-21 NOTE — Progress Notes (Signed)
The patient is here today for a flu vaccination. 

## 2020-11-23 ENCOUNTER — Encounter: Payer: Self-pay | Admitting: Internal Medicine

## 2020-12-04 ENCOUNTER — Encounter: Payer: Self-pay | Admitting: Internal Medicine

## 2020-12-04 ENCOUNTER — Other Ambulatory Visit: Payer: Self-pay | Admitting: Internal Medicine

## 2020-12-04 MED ORDER — TEMAZEPAM 15 MG PO CAPS: 15.0000 mg | ORAL_CAPSULE | Freq: Every evening | ORAL | 0 refills | Status: DC | PRN

## 2020-12-20 ENCOUNTER — Encounter: Payer: Self-pay | Admitting: Family Medicine

## 2021-01-16 ENCOUNTER — Other Ambulatory Visit: Payer: Self-pay | Admitting: Internal Medicine

## 2021-01-17 ENCOUNTER — Other Ambulatory Visit: Payer: Self-pay | Admitting: Internal Medicine

## 2021-01-17 MED ORDER — TEMAZEPAM 15 MG PO CAPS: 15.0000 mg | ORAL_CAPSULE | Freq: Every evening | ORAL | 1 refills | Status: DC | PRN

## 2021-01-18 ENCOUNTER — Other Ambulatory Visit: Payer: Self-pay | Admitting: Internal Medicine

## 2021-01-18 ENCOUNTER — Other Ambulatory Visit: Payer: Self-pay

## 2021-01-18 MED ORDER — TEMAZEPAM 15 MG PO CAPS
15.0000 mg | ORAL_CAPSULE | Freq: Every evening | ORAL | 1 refills | Status: DC | PRN
Start: 2021-01-18 — End: 2021-04-11

## 2021-01-29 ENCOUNTER — Other Ambulatory Visit: Payer: Self-pay | Admitting: Internal Medicine

## 2021-01-29 DIAGNOSIS — F419 Anxiety disorder, unspecified: Secondary | ICD-10-CM

## 2021-04-11 ENCOUNTER — Ambulatory Visit: Payer: BC Managed Care – PPO | Admitting: Internal Medicine

## 2021-04-11 ENCOUNTER — Other Ambulatory Visit: Payer: Self-pay

## 2021-04-11 ENCOUNTER — Encounter: Payer: Self-pay | Admitting: Internal Medicine

## 2021-04-11 VITALS — BP 112/78 | HR 50 | Temp 97.7°F | Ht 74.0 in | Wt 207.4 lb

## 2021-04-11 DIAGNOSIS — E7800 Pure hypercholesterolemia, unspecified: Secondary | ICD-10-CM

## 2021-04-11 DIAGNOSIS — F419 Anxiety disorder, unspecified: Secondary | ICD-10-CM

## 2021-04-11 DIAGNOSIS — Z79899 Other long term (current) drug therapy: Secondary | ICD-10-CM | POA: Diagnosis not present

## 2021-04-11 DIAGNOSIS — Z6826 Body mass index (BMI) 26.0-26.9, adult: Secondary | ICD-10-CM | POA: Diagnosis not present

## 2021-04-11 DIAGNOSIS — F5101 Primary insomnia: Secondary | ICD-10-CM | POA: Diagnosis not present

## 2021-04-11 MED ORDER — TEMAZEPAM 15 MG PO CAPS: 15.0000 mg | ORAL_CAPSULE | Freq: Every evening | ORAL | 1 refills | Status: DC | PRN

## 2021-04-11 MED ORDER — ROSUVASTATIN CALCIUM 5 MG PO TABS: 5.0000 mg | ORAL_TABLET | Freq: Every day | ORAL | 3 refills | Status: DC

## 2021-04-11 MED ORDER — ESCITALOPRAM OXALATE 10 MG PO TABS: ORAL_TABLET | ORAL | 2 refills | Status: DC

## 2021-04-11 NOTE — Patient Instructions (Signed)

## 2021-04-11 NOTE — Progress Notes (Signed)
?Rich Brave Llittleton,acting as a Education administrator for Maximino Greenland, MD.,have documented all relevant documentation on the behalf of Maximino Greenland, MD,as directed by  Maximino Greenland, MD while in the presence of Maximino Greenland, MD.  ?This visit occurred during the SARS-CoV-2 public health emergency.  Safety protocols were in place, including screening questions prior to the visit, additional usage of staff PPE, and extensive cleaning of exam room while observing appropriate contact time as indicated for disinfecting solutions. ? ?Subjective:  ?  ? Patient ID: Curtis [name] , male    DOB: 1970/12/07 , 51 y.o.   MRN: [id number] ? ? ?Chief Complaint  ?Patient presents with  ? Hyperlipidemia  ? Anxiety  ? ? ?HPI ? ?The patient is here today for a cholesterol and anxiety f/u. He reports compliance with his meds. He does not have any questions or concerns at this time. ? ?Hyperlipidemia ?This is a chronic problem. The problem is controlled. Pertinent negatives include no chest pain or leg pain. Current antihyperlipidemic treatment includes statins. The current treatment provides moderate improvement of lipids.  ?Anxiety ?Presents for follow-up visit. Patient reports no chest pain, nervous/anxious behavior, palpitations or panic. The quality of sleep is fair.  ? ?Compliance with medications is 76-100%.   ? ?Past Medical History:  ?Diagnosis Date  ? Acne   ? Allergic rhinitis   ? GERD (gastroesophageal reflux disease)   ? Insomnia   ? Olecranon bursitis   ? Pure hypercholesterolemia   ? Vitamin D deficiency   ?  ? ?Family History  ?Problem Relation Age of Onset  ? Healthy Mother   ? Hypertension Father   ? High Cholesterol Father   ? Healthy Brother   ? ? ? ?Current Outpatient Medications:  ?  Cholecalciferol (VITAMIN D) 2000 units CAPS, Take 1 capsule by mouth daily., Disp: , Rfl:  ?  cyclobenzaprine (AMRIX) 15 MG 24 hr capsule, Take 1 capsule (15 mg total) by mouth daily as needed for muscle spasms., Disp: 30 capsule, Rfl: 0 ?   meloxicam (MOBIC) 15 MG tablet, TAKE 1 TABLET(15 MG) BY MOUTH DAILY AS NEEDED FOR PAIN, Disp: 60 tablet, Rfl: 0 ?  Omega-3 Fatty Acids (FISH OIL) 500 MG CAPS, Take 1 capsule by mouth daily., Disp: , Rfl:  ?  escitalopram (LEXAPRO) 10 MG tablet, TAKE 1 TABLET DAILY, Disp: 90 tablet, Rfl: 2 ?  rosuvastatin (CRESTOR) 5 MG tablet, Take 1 tablet (5 mg total) by mouth daily., Disp: 90 tablet, Rfl: 3 ?  temazepam (RESTORIL) 15 MG capsule, Take 1 capsule (15 mg total) by mouth at bedtime as needed for sleep., Disp: 60 capsule, Rfl: 1  ? ?No Known Allergies  ? ?Review of Systems  ?Constitutional: Negative.   ?Eyes: Negative.   ?Respiratory: Negative.    ?Cardiovascular: Negative.  Negative for chest pain and palpitations.  ?Gastrointestinal: Negative.   ?Skin: Negative.   ?Psychiatric/Behavioral:  The patient is not nervous/anxious.    ? ?Today's Vitals  ? 04/11/21 1016  ?BP: 112/78  ?Pulse: (!) 50  ?Temp: 97.7 ?F (36.5 ?C)  ?Weight: 207 lb 6.4 oz (94.1 kg)  ?Height: 6' 2"  (1.88 m)  ?PainSc: 0-No pain  ? ?Body mass index is 26.63 kg/m?.  ?Wt Readings from Last 3 Encounters:  ?04/11/21 207 lb 6.4 oz (94.1 kg)  ?10/10/20 200 lb 12.8 oz (91.1 kg)  ?08/22/20 196 lb (88.9 kg)  ?  ? ?Objective:  ?Physical Exam ?Vitals and nursing note reviewed.  ?Constitutional:   ?  Appearance: Normal appearance.  ?HENT:  ?   Head: Normocephalic and atraumatic.  ?   Nose:  ?   Comments: Masked  ?   Mouth/Throat:  ?   Comments: Masked  ?Eyes:  ?   Extraocular Movements: Extraocular movements intact.  ?Cardiovascular:  ?   Rate and Rhythm: Normal rate and regular rhythm.  ?   Heart sounds: Normal heart sounds.  ?Pulmonary:  ?   Effort: Pulmonary effort is normal.  ?   Breath sounds: Normal breath sounds.  ?Skin: ?   General: Skin is warm.  ?Neurological:  ?   General: No focal deficit present.  ?   Mental Status: He is alert.  ?Psychiatric:     ?   Mood and Affect: Mood normal.  ?  ? ?   ?Assessment And Plan:  ?   ?1. Pure  hypercholesterolemia ?Comments: Chronic, he will c/w rosuvastatin. I will check LFTs today. He will rto in six months for a full physical exam.  ?- CMP14+EGFR ? ?2. Anxiety ?Comments: He states escitalopram, 67m effectively controls his sx of anxiety. He will rto in six months for re-evaluation.  ?- escitalopram (LEXAPRO) 10 MG tablet; TAKE 1 TABLET DAILY  Dispense: 90 tablet; Refill: 2 ? ?3. Primary insomnia ?Comments: Chronic, he will c/w temazepam nightly prn. He is reminded to practice good bedtime hygiene.  ? ?4. BMI 26.0-26.9,adult ?Comments: He is encouraged to aim for at least 150 minutes of exercise per week.  ? ?5. Drug therapy ?- CMP14+EGFR ?  ?Patient was given opportunity to ask questions. Patient verbalized understanding of the plan and was able to repeat key elements of the plan. All questions were answered to their satisfaction.  ? ?I, RMaximino Greenland MD, have reviewed all documentation for this visit. The documentation on 04/11/21 for the exam, diagnosis, procedures, and orders are all accurate and complete.  ? ?IF YOU HAVE BEEN REFERRED TO A SPECIALIST, IT MAY TAKE 1-2 WEEKS TO SCHEDULE/PROCESS THE REFERRAL. IF YOU HAVE NOT HEARD FROM US/SPECIALIST IN TWO WEEKS, PLEASE GIVE [facility] CALL AT [phone] X [phone].  ? ?THE PATIENT IS ENCOURAGED TO PRACTICE SOCIAL DISTANCING DUE TO THE COVID-19 PANDEMIC.   ?

## 2021-04-12 LAB — CMP14+EGFR
ALT: 37 IU/L (ref 0–44)
AST: 31 IU/L (ref 0–40)
Albumin/Globulin Ratio: 2.8 — ABNORMAL HIGH (ref 1.2–2.2)
Albumin: 5.1 g/dL — ABNORMAL HIGH (ref 4.0–5.0)
Alkaline Phosphatase: 70 IU/L (ref 44–121)
BUN/Creatinine Ratio: 11 (ref 9–20)
BUN: 11 mg/dL (ref 6–24)
Bilirubin Total: 0.4 mg/dL (ref 0.0–1.2)
CO2: 25 mmol/L (ref 20–29)
Calcium: 9.8 mg/dL (ref 8.7–10.2)
Chloride: 103 mmol/L (ref 96–106)
Creatinine, Ser: 1.02 mg/dL (ref 0.76–1.27)
Globulin, Total: 1.8 g/dL (ref 1.5–4.5)
Glucose: 87 mg/dL (ref 70–99)
Potassium: 4.5 mmol/L (ref 3.5–5.2)
Sodium: 141 mmol/L (ref 134–144)
Total Protein: 6.9 g/dL (ref 6.0–8.5)
eGFR: 91 mL/min/{1.73_m2} (ref >59)

## 2021-04-20 DIAGNOSIS — M9901 Segmental and somatic dysfunction of cervical region: Secondary | ICD-10-CM | POA: Diagnosis not present

## 2021-04-20 DIAGNOSIS — M9905 Segmental and somatic dysfunction of pelvic region: Secondary | ICD-10-CM | POA: Diagnosis not present

## 2021-04-20 DIAGNOSIS — M9902 Segmental and somatic dysfunction of thoracic region: Secondary | ICD-10-CM | POA: Diagnosis not present

## 2021-04-20 DIAGNOSIS — M9903 Segmental and somatic dysfunction of lumbar region: Secondary | ICD-10-CM | POA: Diagnosis not present

## 2021-04-20 DIAGNOSIS — M5451 Vertebrogenic low back pain: Secondary | ICD-10-CM | POA: Diagnosis not present

## 2021-06-19 DIAGNOSIS — L578 Other skin changes due to chronic exposure to nonionizing radiation: Secondary | ICD-10-CM | POA: Diagnosis not present

## 2021-06-19 DIAGNOSIS — L820 Inflamed seborrheic keratosis: Secondary | ICD-10-CM | POA: Diagnosis not present

## 2021-06-19 DIAGNOSIS — L700 Acne vulgaris: Secondary | ICD-10-CM | POA: Diagnosis not present

## 2021-06-19 DIAGNOSIS — L821 Other seborrheic keratosis: Secondary | ICD-10-CM | POA: Diagnosis not present

## 2021-06-26 DIAGNOSIS — S0501XA Injury of conjunctiva and corneal abrasion without foreign body, right eye, initial encounter: Secondary | ICD-10-CM | POA: Diagnosis not present

## 2021-06-28 DIAGNOSIS — S0501XD Injury of conjunctiva and corneal abrasion without foreign body, right eye, subsequent encounter: Secondary | ICD-10-CM | POA: Diagnosis not present

## 2021-07-03 DIAGNOSIS — S0501XD Injury of conjunctiva and corneal abrasion without foreign body, right eye, subsequent encounter: Secondary | ICD-10-CM | POA: Diagnosis not present

## 2021-07-28 DIAGNOSIS — G4733 Obstructive sleep apnea (adult) (pediatric): Secondary | ICD-10-CM | POA: Diagnosis not present

## 2021-08-03 DIAGNOSIS — Q149 Congenital malformation of posterior segment of eye, unspecified: Secondary | ICD-10-CM | POA: Diagnosis not present

## 2021-08-21 NOTE — Patient Instructions (Signed)

## 2021-08-21 NOTE — Progress Notes (Unsigned)
PATIENT: Curtis [name] DOB: 06-02-1970  REASON FOR VISIT: follow up HISTORY FROM: patient  No chief complaint on file.    HISTORY OF PRESENT ILLNESS:  08/21/21 ALL: [name] returns for follow up for central sleep apnea on CPAP.      08/22/2020 ALL: [name] returns for follow up for central sleep apnea on CPAP and insomnia. He was previously taking trazodone 50mg  but switched to temazepam 15mg  by PCP and escitalopram 10mg  daily. He feels that he is doing very well. He is sleeping better. He is using CPAP nightly. He did have an issue with his tubing that prevented usage a couple of nights last month but he usually uses CPAP every night. No concerns with [employer].     08/23/2019 ALL:  [name] is a 51 y.o. male here today for follow up for central sleep apnea treated with CPAP and insomnia on trazodone 50mg  daily. He reports that he is doing well. He is using CPAP nightly. Trazodone has helped him stay asleep longer. He seems to wake most days around 4-5am. He also takes melatonin every night and occasionally will take temazepam prescribed by PCP. He is feeling well today and without concerns.   Compliance report dated 07/20/2019 through 08/18/2019 reveals that he has used CPAP 29 of the past 30 days for compliance of 97%.  He used CPAP greater than 4 hours 27 of the past 30 days for compliance of 90%.  Average usage was 7 hours and 45 minutes.  Residual AHI was 2.0 on 5 to 9 cm of water and an EPR of 2.  There was no leak noted.  HISTORY: (copied from Dr Dohmeier's note on 02/22/2019)  RV 02-22-2019, I have the pleasure of meeting with 07/22/2019 today, an established complex sleep apnea patient ,who also has insomnia. While his primary central sleep apnea has been controlled he still has difficulties to  sleep through the night. His compliance on his air sense 10 machine is excellent he uses a very small pressure window between 5 and 9 cmH2O this 2 cm expiratory pressure relief,  average use at time 6 hours 25 minutes and he has a 93% compliance by days.  His residual AHI is 2.3 and the residual apneas are mostly central at 1.5/h however this is a good resolution of apnea.  She endorsed the fatigue severity scale 24 points out of 63.  He endorsed the Epworth sleepiness score at only 4 out of 24 points. He goes to sleep around 10 10:30 PM but he has problems with waking up early at around 4 AM but he does not need to get up and it is not desired to get up.  However there are also days and weeks where he can catch 7 hours of sleep alternating with days and nights of 5 or 5-1/2 hours of sleep.  The early morning awakening may be related to low serotonin levels.  I will be happy to discuss cyclic sleep disorders here today.   02-18-2018,  RV after 2 sleep studies.  [name] is a 51 y.o. caucasian, right handed  male patient and was seen in a referral from Dr. [name] for a sleep medicine consultation.  [name] underwent a baseline polysomnography with an expanded EEG montage of 29 September 2017 which ended up in a surprising result he had by far dominating central apneas of obstructive apneas.  58 central apneas were seen 10 obstructive 7 2 mixed as well as 44 hypopneas.  The  total AHI was 17.5 in the mild-to-moderate range of the RDI was not higher.  During REM sleep there was a less apnea which is a typical distribution for a central apnea patient.  In supine sleep there were 28.5 versus nonsupine 6.0/h.  He also had sinus bradycardia which can be also related to his athletic activities.  Primary snoring was noted and be due to the central apnea nature we asked him to return for an attended CPAP titration which took place on 7 October.  The AHI was reduced to 5.2 overall at a final pressure of 8 cmH2O there was a reduction of 0.0 AHI and 100% sleep efficiency.  The technologist gave the patient a Simplus mask which is a fullface model in large size.  He is now using an auto titration  CPAP with a pressure range between 5 and 9 cmH2O and one 1 cm EPR his compliance was 97% by days and 25 of those days were over 4 hours of daily use with an average user time of 6 hours and 8 minutes.  The residual AHI is 5.0 and the residual apneas are still central in nature.  However we manage not to provoke more central apneas.   He feels as if he is still not sleeping super long, but his sleep has improved, his sleep quality is more restorative. Bradycardia. He trains 4 days a week, cardio and weight lifting.    Epworth changed on CPAP to 2 points and FSS to 13 points.      HPI: Chief complaint according to patient : "Increasingly fatigued, and sleepy. I have wake up early and can't go back to sleep ". He works for [employer] as a Psychologist, counselling. [name] reports that he has noted certain cycles or phases of sleep changes.  There will be times where he wakes up at the exact time every morning for example 3; 40 minutes AM and after a couple of weeks or so this again will change. It has never been difficult for him to go to sleep in the first place but if he goes to early he also wakes up early.  He craves to be able to sleep 7 or 8 hours but this has rarely happened. He hasn't slept in daytime , has never been a "napper".   Sleep habits are as follows: The patient aims for a bedtime between 10 and 11 PM, if he would go to sleep earlier he would wake up earlier and he always avoids going to sleep later than midnight. He feels pretty good at this routine. Bedroom is cool , quiet and dark. He shares the bedroom with his wife, but he feels usually better when alone in bed. He sleeps poorly in hotels.  Kids are 8 and 11, no pets in the bedroom. Bathroom break will wake him up some days, mostly around 3 or 4 AM.  He has vivid dreams- but feels these don't wake him, no pain or discomfort that interrupts his sleep. Used to have some low back pain that has resolved.   No enactment, no sleep . Wife reports snoring,  mildly and more after alcohol ( weekends ) and only on his back.  He wakes at 6. 30 AM spontaneously. He is always able to wake without alarm. He feels refreshed some days, only after at least 6 hours of sleep. Often he is fatigued.    Sleep medical history and family sleep history: father was a snorer, lost weight- CPAP user. Mother  is a light sleeper. One brother who is  younger by 3.5 years sleeps well.     Social history: he practices good sleep hygiene. He doesn't have electronics in the bedroom- married, 2 children in school age. Sales rep , travels by car.  Non smoker, exerciser, ETOH- wine or beer on weekends, 2-3 beers per WE evening, caffeine - no soda, 2 cups of coffee in AM, sweet tea at lunch.      REVIEW OF SYSTEMS: Out of a complete 14 system review of symptoms, the patient complains only of the following symptoms, insomnia and all other reviewed systems are negative.  ESS: 1 FSS: 9   ALLERGIES: No Known Allergies  HOME MEDICATIONS: Outpatient Medications Prior to Visit  Medication Sig Dispense Refill   Cholecalciferol (VITAMIN D) 2000 units CAPS Take 1 capsule by mouth daily.     cyclobenzaprine (AMRIX) 15 MG 24 hr capsule Take 1 capsule (15 mg total) by mouth daily as needed for muscle spasms. 30 capsule 0   escitalopram (LEXAPRO) 10 MG tablet TAKE 1 TABLET DAILY 90 tablet 2   meloxicam (MOBIC) 15 MG tablet TAKE 1 TABLET(15 MG) BY MOUTH DAILY AS NEEDED FOR PAIN 60 tablet 0   Omega-3 Fatty Acids (FISH OIL) 500 MG CAPS Take 1 capsule by mouth daily.     rosuvastatin (CRESTOR) 5 MG tablet Take 1 tablet (5 mg total) by mouth daily. 90 tablet 3   temazepam (RESTORIL) 15 MG capsule Take 1 capsule (15 mg total) by mouth at bedtime as needed for sleep. 60 capsule 1   No facility-administered medications prior to visit.    PAST MEDICAL HISTORY: Past Medical History:  Diagnosis Date   Acne    Allergic rhinitis    GERD (gastroesophageal reflux disease)    Insomnia     Olecranon bursitis    Pure hypercholesterolemia    Vitamin D deficiency     PAST SURGICAL HISTORY: Past Surgical History:  Procedure Laterality Date   INGUINAL HERNIA REPAIR Right    WISDOM TOOTH EXTRACTION      FAMILY HISTORY: Family History  Problem Relation Age of Onset   Healthy Mother    Hypertension Father    High Cholesterol Father    Healthy Brother     SOCIAL HISTORY: Social History   Socioeconomic History   Marital status: Married    Spouse name: Not on file   Number of children: Not on file   Years of education: Not on file   Highest education level: Not on file  Occupational History   Not on file  Tobacco Use   Smoking status: Never   Smokeless tobacco: Never  Vaping Use   Vaping Use: Never used  Substance and Sexual Activity   Alcohol use: Yes    Alcohol/week: 6.0 standard drinks of alcohol    Types: 6 Glasses of wine per week   Drug use: Never   Sexual activity: Not on file  Other Topics Concern   Not on file  Social History Narrative   Not on file   Social Determinants of Health   Financial Resource Strain: Not on file  Food Insecurity: Not on file  Transportation Needs: Not on file  Physical Activity: Not on file  Stress: Not on file  Social Connections: Not on file  Intimate Partner Violence: Not on file      PHYSICAL EXAM  There were no vitals filed for this visit.   There is no height or weight on file  to calculate BMI.  Generalized: Well developed, in no acute distress  Cardiology: normal rate and rhythm, no murmur noted Respiratory: clear to auscultation bilaterally  Neurological examination  Mentation: Alert oriented to time, place, history taking. Follows all commands speech and language fluent Cranial nerve II-XII: Pupils were equal round reactive to light. Extraocular movements were full, visual field were full  Motor: The motor testing reveals 5 over 5 strength of all 4 extremities. Good symmetric motor tone is noted  throughout.   Gait and station: Gait is normal.   DIAGNOSTIC DATA (LABS, IMAGING, TESTING) - I reviewed patient records, labs, notes, testing and imaging myself where available.      No data to display           Lab Results  Component Value Date   WBC 4.2 10/10/2020   HGB 15.5 10/10/2020   HCT 47.0 10/10/2020   MCV 95 10/10/2020   PLT 224 10/10/2020      Component Value Date/Time   NA 141 04/11/2021 1055   K 4.5 04/11/2021 1055   CL 103 04/11/2021 1055   CO2 25 04/11/2021 1055   GLUCOSE 87 04/11/2021 1055   BUN 11 04/11/2021 1055   CREATININE 1.02 04/11/2021 1055   CALCIUM 9.8 04/11/2021 1055   PROT 6.9 04/11/2021 1055   ALBUMIN 5.1 (H) 04/11/2021 1055   AST 31 04/11/2021 1055   ALT 37 04/11/2021 1055   ALKPHOS 70 04/11/2021 1055   BILITOT 0.4 04/11/2021 1055   GFRNONAA 75 09/29/2019 0948   GFRAA 86 09/29/2019 0948   Lab Results  Component Value Date   CHOL 149 10/10/2020   HDL 78 10/10/2020   LDLCALC 62 10/10/2020   TRIG 40 10/10/2020   CHOLHDL 1.9 10/10/2020   Lab Results  Component Value Date   HGBA1C 5.1 09/07/2018   No results found for: "VITAMINB12" No results found for: "TSH"     ASSESSMENT AND PLAN 51 y.o. year old male  has a past medical history of Acne, Allergic rhinitis, GERD (gastroesophageal reflux disease), Insomnia, Olecranon bursitis, Pure hypercholesterolemia, and Vitamin D deficiency. here with     ICD-10-CM   1. Controlled primary central sleep apnea  G47.31     2. Sleep apnea with use of continuous positive airway pressure (CPAP)  G47.30         Brey is doing well on CPAP therapy.  Compliance report reveals excellent compliance.  He was encouraged to continue using CPAP nightly and for greater than 4 hours each night. Insomnia is much better on temazepam 15mg  and escitalopram 10mg  at bedtime, managed by PCP. We will continue this medication. Healthy lifestyle habits encouraged.  He will follow-up with in 1 year, sooner if  needed.  He verbalizes understanding and agreement with this plan.   No orders of the defined types were placed in this encounter.     No orders of the defined types were placed in this encounter.      , FNP-C 08/21/2021, 4:48 PM [facility] [address] [place], [address] [place] [phone]

## 2021-08-22 ENCOUNTER — Encounter: Payer: Self-pay | Admitting: Family Medicine

## 2021-08-22 ENCOUNTER — Ambulatory Visit: Payer: BC Managed Care – PPO | Admitting: Family Medicine

## 2021-08-22 VITALS — BP 111/82 | HR 54 | Ht 73.0 in | Wt 205.5 lb

## 2021-08-22 DIAGNOSIS — G4731 Primary central sleep apnea: Secondary | ICD-10-CM | POA: Diagnosis not present

## 2021-08-22 DIAGNOSIS — G4730 Sleep apnea, unspecified: Secondary | ICD-10-CM

## 2021-08-22 NOTE — Progress Notes (Signed)
CM sent to [facility] for new order ?

## 2021-08-27 DIAGNOSIS — G4733 Obstructive sleep apnea (adult) (pediatric): Secondary | ICD-10-CM | POA: Diagnosis not present

## 2021-10-03 ENCOUNTER — Other Ambulatory Visit: Payer: Self-pay

## 2021-10-03 ENCOUNTER — Other Ambulatory Visit: Payer: Self-pay | Admitting: Internal Medicine

## 2021-10-03 MED ORDER — TEMAZEPAM 15 MG PO CAPS: 15.0000 mg | ORAL_CAPSULE | Freq: Every evening | ORAL | 1 refills | Status: DC | PRN

## 2021-10-03 MED ORDER — CYCLOBENZAPRINE HCL ER 15 MG PO CP24: 15.0000 mg | ORAL_CAPSULE | Freq: Every day | ORAL | 0 refills | Status: DC | PRN

## 2021-10-03 MED ORDER — LEVOCETIRIZINE DIHYDROCHLORIDE 5 MG PO TABS: 5.0000 mg | ORAL_TABLET | Freq: Every evening | ORAL | 2 refills | Status: DC

## 2021-10-18 ENCOUNTER — Ambulatory Visit (INDEPENDENT_AMBULATORY_CARE_PROVIDER_SITE_OTHER): Payer: 59 | Admitting: Internal Medicine

## 2021-10-18 ENCOUNTER — Encounter: Payer: Self-pay | Admitting: Internal Medicine

## 2021-10-18 VITALS — BP 116/80 | HR 56 | Temp 97.4°F | Ht 71.2 in | Wt 202.4 lb

## 2021-10-18 DIAGNOSIS — Z111 Encounter for screening for respiratory tuberculosis: Secondary | ICD-10-CM | POA: Diagnosis not present

## 2021-10-18 DIAGNOSIS — Z0000 Encounter for general adult medical examination without abnormal findings: Secondary | ICD-10-CM

## 2021-10-18 DIAGNOSIS — Z6828 Body mass index (BMI) 28.0-28.9, adult: Secondary | ICD-10-CM | POA: Diagnosis not present

## 2021-10-18 MED ORDER — CYCLOBENZAPRINE HCL 10 MG PO TABS: 10.0000 mg | ORAL_TABLET | Freq: Three times a day (TID) | ORAL | 0 refills | Status: DC | PRN

## 2021-10-18 NOTE — Patient Instructions (Signed)
Health Maintenance, Male Adopting a healthy lifestyle and getting preventive care are important in promoting health and wellness. Ask your health care provider about: The right schedule for you to have regular tests and exams. Things you can do on your own to prevent diseases and keep yourself healthy. What should I know about diet, weight, and exercise? Eat a healthy diet  Eat a diet that includes plenty of vegetables, fruits, low-fat dairy products, and lean protein. Do not eat a lot of foods that are high in solid fats, added sugars, or sodium. Maintain a healthy weight Body mass index (BMI) is a measurement that can be used to identify possible weight problems. It estimates body fat based on height and weight. Your health care provider can help determine your BMI and help you achieve or maintain a healthy weight. Get regular exercise Get regular exercise. This is one of the most important things you can do for your health. Most adults should: Exercise for at least 150 minutes each week. The exercise should increase your heart rate and make you sweat (moderate-intensity exercise). Do strengthening exercises at least twice a week. This is in addition to the moderate-intensity exercise. Spend less time sitting. Even light physical activity can be beneficial. Watch cholesterol and blood lipids Have your blood tested for lipids and cholesterol at 49 years of age, then have this test every 5 years. You may need to have your cholesterol levels checked more often if: Your lipid or cholesterol levels are high. You are older than 49 years of age. You are at high risk for heart disease. What should I know about cancer screening? Many types of cancers can be detected early and may often be prevented. Depending on your health history and family history, you may need to have cancer screening at various ages. This may include screening for: Colorectal cancer. Prostate cancer. Skin cancer. Lung  cancer. What should I know about heart disease, diabetes, and high blood pressure? Blood pressure and heart disease High blood pressure causes heart disease and increases the risk of stroke. This is more likely to develop in people who have high blood pressure readings or are overweight. Talk with your health care provider about your target blood pressure readings. Have your blood pressure checked: Every 3-5 years if you are 18-39 years of age. Every year if you are 40 years old or older. If you are between the ages of 65 and 75 and are a current or former smoker, ask your health care provider if you should have a one-time screening for abdominal aortic aneurysm (AAA). Diabetes Have regular diabetes screenings. This checks your fasting blood sugar level. Have the screening done: Once every three years after age 45 if you are at a normal weight and have a low risk for diabetes. More often and at a younger age if you are overweight or have a high risk for diabetes. What should I know about preventing infection? Hepatitis B If you have a higher risk for hepatitis B, you should be screened for this virus. Talk with your health care provider to find out if you are at risk for hepatitis B infection. Hepatitis C Blood testing is recommended for: Everyone born from 1945 through 1965. Anyone with known risk factors for hepatitis C. Sexually transmitted infections (STIs) You should be screened each year for STIs, including gonorrhea and chlamydia, if: You are sexually active and are younger than 49 years of age. You are older than 49 years of age and your   health care provider tells you that you are at risk for this type of infection. Your sexual activity has changed since you were last screened, and you are at increased risk for chlamydia or gonorrhea. Ask your health care provider if you are at risk. Ask your health care provider about whether you are at high risk for HIV. Your health care provider  may recommend a prescription medicine to help prevent HIV infection. If you choose to take medicine to prevent HIV, you should first get tested for HIV. You should then be tested every 3 months for as long as you are taking the medicine. Follow these instructions at home: Alcohol use Do not drink alcohol if your health care provider tells you not to drink. If you drink alcohol: Limit how much you have to 0-2 drinks a day. Know how much alcohol is in your drink. In the [place] one drink equals one 12 oz bottle of beer (355 mL), one 5 oz glass of wine (148 mL), or one 1 oz glass of hard liquor (44 mL). Lifestyle Do not use any products that contain nicotine or tobacco. These products include cigarettes, chewing tobacco, and vaping devices, such as e-cigarettes. If you need help quitting, ask your health care provider. Do not use street drugs. Do not share needles. Ask your health care provider for help if you need support or information about quitting drugs. General instructions Schedule regular health, dental, and eye exams. Stay current with your vaccines. Tell your health care provider if: You often feel depressed. You have ever been abused or do not feel safe at home. Summary Adopting a healthy lifestyle and getting preventive care are important in promoting health and wellness. Follow your health care provider's instructions about healthy diet, exercising, and getting tested or screened for diseases. Follow your health care provider's instructions on monitoring your cholesterol and blood pressure. This information is not intended to replace advice given to you by your health care provider. Make sure you discuss any questions you have with your health care provider. Document Revised: 06/12/2020 Document Reviewed: 06/12/2020 Elsevier Patient Education  2023 Elsevier Inc.  

## 2021-10-18 NOTE — Progress Notes (Signed)
Curtis [name],acting as a Education administrator for Curtis Greenland, MD.,have documented all relevant documentation on the behalf of Curtis Greenland, MD,as directed by  Curtis Greenland, MD while in the presence of Curtis Greenland, MD.   Subjective:     Patient ID: Curtis [name] , male    DOB: 14-Jan-1971 , 51 y.o.   MRN: [id number]   Chief Complaint  Patient presents with   Annual Exam    HPI  He is here today for a full physical examination. He has no specific concerns or complaints at this time. He reports compliance with meds.      Past Medical History:  Diagnosis Date   Acne    Allergic rhinitis    GERD (gastroesophageal reflux disease)    Insomnia    Olecranon bursitis    Pure hypercholesterolemia    Vitamin D deficiency      Family History  Problem Relation Age of Onset   Healthy Mother    Hypertension Father    High Cholesterol Father    Healthy Brother      Current Outpatient Medications:    Cholecalciferol (VITAMIN D) 2000 units CAPS, Take 1 capsule by mouth daily., Disp: , Rfl:    cyclobenzaprine (FLEXERIL) 10 MG tablet, Take 1 tablet (10 mg total) by mouth 3 (three) times daily as needed for muscle spasms., Disp: 30 tablet, Rfl: 0   escitalopram (LEXAPRO) 10 MG tablet, TAKE 1 TABLET DAILY, Disp: 90 tablet, Rfl: 2   levocetirizine (XYZAL) 5 MG tablet, Take 1 tablet (5 mg total) by mouth every evening., Disp: 90 tablet, Rfl: 2   meloxicam (MOBIC) 15 MG tablet, TAKE 1 TABLET(15 MG) BY MOUTH DAILY AS NEEDED FOR PAIN, Disp: 60 tablet, Rfl: 0   Omega-3 Fatty Acids (FISH OIL) 500 MG CAPS, Take 1 capsule by mouth daily., Disp: , Rfl:    rosuvastatin (CRESTOR) 5 MG tablet, Take 1 tablet (5 mg total) by mouth daily., Disp: 90 tablet, Rfl: 3   temazepam (RESTORIL) 15 MG capsule, Take 1 capsule (15 mg total) by mouth at bedtime as needed for sleep., Disp: 60 capsule, Rfl: 1   No Known Allergies   Men's preventive visit. Patient Health Questionnaire (PHQ-2) is  Curtis [name]  Office Visit from 10/18/2021 in [facility]  PHQ-2 Total Score 0     . Patient is on a healthy diet. Marital status: Married. Relevant history for alcohol use is:  Social History   Substance and Sexual Activity  Alcohol Use Yes   Alcohol/week: 6.0 standard drinks of alcohol   Types: 6 Glasses of wine per week  . Relevant history for tobacco use is:  Social History   Tobacco Use  Smoking Status Never  Smokeless Tobacco Never  .   Review of Systems  Constitutional: Negative.   HENT: Negative.    Eyes: Negative.   Respiratory: Negative.    Cardiovascular: Negative.   Gastrointestinal: Negative.   Endocrine: Negative.   Genitourinary: Negative.   Musculoskeletal: Negative.   Skin: Negative.   Neurological: Negative.   Hematological: Negative.   Psychiatric/Behavioral: Negative.       Today's Vitals   10/18/21 0857  BP: 116/80  Pulse: (!) 56  Temp: (!) 97.4 F (36.3 C)  Weight: 202 lb 6.4 oz (91.8 kg)  Height: 5' 11.2" (1.808 m)  PainSc: 0-No pain   Body mass index is 28.07 kg/m.  Wt Readings from Last 3 Encounters:  10/18/21 202 lb 6.4 oz (91.8  kg)  08/22/21 205 lb 8 oz (93.2 kg)  04/11/21 207 lb 6.4 oz (94.1 kg)     Objective:  Physical Exam Vitals and nursing note reviewed.  Constitutional:      Appearance: Normal appearance.  HENT:     Head: Normocephalic and atraumatic.     Right Ear: Tympanic membrane, ear canal and external ear normal.     Left Ear: Tympanic membrane, ear canal and external ear normal.     Nose:     Comments: Masked     Mouth/Throat:     Comments: Masked  Eyes:     Extraocular Movements: Extraocular movements intact.     Conjunctiva/sclera: Conjunctivae normal.     Pupils: Pupils are equal, round, and reactive to light.  Cardiovascular:     Rate and Rhythm: Normal rate and regular rhythm.     Pulses: Normal pulses.     Heart sounds: Normal heart sounds.  Pulmonary:     Effort: Pulmonary effort is  normal.     Breath sounds: Normal breath sounds.  Chest:  Breasts:    Right: Normal. No swelling, bleeding, inverted nipple, mass or nipple discharge.     Left: Normal. No swelling, bleeding, inverted nipple, mass or nipple discharge.  Abdominal:     General: Abdomen is flat. Bowel sounds are normal.     Palpations: Abdomen is soft.  Genitourinary:    Comments: Deferred  Musculoskeletal:        General: Normal range of motion.     Cervical back: Normal range of motion and neck supple.  Skin:    General: Skin is warm.  Neurological:     General: No focal deficit present.     Mental Status: He is alert.  Psychiatric:        Mood and Affect: Mood normal.        Behavior: Behavior normal.      Assessment And Plan:    1. Encounter for general adult medical examination w/o abnormal findings Comments: A full exam was performed. DRE deferred, per patient request. He agrees to Urology referral at age 41.  PATIENT IS ADVISED TO GET 30-45 MINUTES REGULAR EXERCISE NO LESS THAN FOUR TO FIVE DAYS PER WEEK - BOTH WEIGHTBEARING EXERCISES AND AEROBIC ARE RECOMMENDED.  PATIENT IS ADVISED TO FOLLOW A HEALTHY DIET WITH AT LEAST SIX FRUITS/VEGGIES PER DAY, DECREASE INTAKE OF RED MEAT, AND TO INCREASE FISH INTAKE TO TWO DAYS PER WEEK.  MEATS/FISH SHOULD NOT BE FRIED, BAKED OR BROILED IS PREFERABLE.  IT IS ALSO IMPORTANT TO CUT BACK ON YOUR SUGAR INTAKE. PLEASE AVOID ANYTHING WITH ADDED SUGAR, CORN SYRUP OR OTHER SWEETENERS. IF YOU MUST USE A SWEETENER, YOU CAN TRY STEVIA. IT IS ALSO IMPORTANT TO AVOID ARTIFICIALLY SWEETENERS AND DIET BEVERAGES. LASTLY, I SUGGEST WEARING SPF 50 SUNSCREEN ON EXPOSED PARTS AND ESPECIALLY WHEN IN THE DIRECT SUNLIGHT FOR AN EXTENDED PERIOD OF TIME.  PLEASE AVOID FAST FOOD RESTAURANTS AND INCREASE YOUR WATER INTAKE. - CMP14+EGFR - Lipid panel - CBC no Diff - PSA Total (Reflex To Free)  2. Screening for tuberculosis - QuantiFERON-TB Gold Plus  3. BMI  28.0-28.9,adult Comments: He is encouraged to aim for at least 150 minutes of exercise per week.   Patient was given opportunity to ask questions. Patient verbalized understanding of the plan and was able to repeat key elements of the plan. All questions were answered to their satisfaction.   I, Curtis Greenland, MD, have reviewed all documentation for this  visit. The documentation on 10/18/21 for the exam, diagnosis, procedures, and orders are all accurate and complete.   THE PATIENT IS ENCOURAGED TO PRACTICE SOCIAL DISTANCING DUE TO THE COVID-19 PANDEMIC.

## 2021-10-19 LAB — LIPID PANEL
Chol/HDL Ratio: 1.9 ratio (ref 0.0–5.0)
Cholesterol, Total: 153 mg/dL (ref 100–199)
HDL: 81 mg/dL (ref >39)
LDL Chol Calc (NIH): 61 mg/dL (ref 0–99)
Triglycerides: 53 mg/dL (ref 0–149)
VLDL Cholesterol Cal: 11 mg/dL (ref 5–40)

## 2021-10-23 LAB — CBC
Hematocrit: 44.6 % (ref 37.5–51.0)
Hemoglobin: 15.7 g/dL (ref 13.0–17.7)
MCH: 32.6 pg (ref 26.6–33.0)
MCHC: 35.2 g/dL (ref 31.5–35.7)
MCV: 93 fL (ref 79–97)
Platelets: 185 10*3/uL (ref 150–450)
RBC: 4.81 x10E6/uL (ref 4.14–5.80)
RDW: 12.4 % (ref 11.6–15.4)
WBC: 3.5 10*3/uL (ref 3.4–10.8)

## 2021-10-23 LAB — CMP14+EGFR
ALT: 71 IU/L — ABNORMAL HIGH (ref 0–44)
AST: 59 IU/L — ABNORMAL HIGH (ref 0–40)
Albumin/Globulin Ratio: 2.8 — ABNORMAL HIGH (ref 1.2–2.2)
Albumin: 5 g/dL (ref 4.1–5.1)
Alkaline Phosphatase: 68 IU/L (ref 44–121)
BUN/Creatinine Ratio: 11 (ref 9–20)
BUN: 12 mg/dL (ref 6–24)
Bilirubin Total: 0.3 mg/dL (ref 0.0–1.2)
CO2: 24 mmol/L (ref 20–29)
Calcium: 9.9 mg/dL (ref 8.7–10.2)
Chloride: 104 mmol/L (ref 96–106)
Creatinine, Ser: 1.1 mg/dL (ref 0.76–1.27)
Globulin, Total: 1.8 g/dL (ref 1.5–4.5)
Glucose: 93 mg/dL (ref 70–99)
Potassium: 4.1 mmol/L (ref 3.5–5.2)
Sodium: 145 mmol/L — ABNORMAL HIGH (ref 134–144)
Total Protein: 6.8 g/dL (ref 6.0–8.5)
eGFR: 82 mL/min/{1.73_m2} (ref >59)

## 2021-10-23 LAB — QUANTIFERON-TB GOLD PLUS
QuantiFERON Mitogen Value: >10 IU/mL
QuantiFERON Nil Value: 0.07 IU/mL
QuantiFERON TB1 Ag Value: 0.07 IU/mL
QuantiFERON TB2 Ag Value: 0.09 IU/mL
QuantiFERON-TB Gold Plus: NEGATIVE

## 2021-10-23 LAB — PSA TOTAL (REFLEX TO FREE): Prostate Specific Ag, Serum: 0.6 ng/mL (ref 0.0–4.0)

## 2021-11-19 ENCOUNTER — Other Ambulatory Visit: Payer: 59

## 2021-11-19 DIAGNOSIS — R899 Unspecified abnormal finding in specimens from other organs, systems and tissues: Secondary | ICD-10-CM

## 2021-11-20 ENCOUNTER — Encounter: Payer: Self-pay | Admitting: Internal Medicine

## 2021-11-20 LAB — HEPATIC FUNCTION PANEL
ALT: 54 IU/L — ABNORMAL HIGH (ref 0–44)
AST: 44 IU/L — ABNORMAL HIGH (ref 0–40)
Albumin: 4.7 g/dL (ref 4.1–5.1)
Alkaline Phosphatase: 68 IU/L (ref 44–121)
Bilirubin Total: 0.6 mg/dL (ref 0.0–1.2)
Bilirubin, Direct: 0.23 mg/dL (ref 0.00–0.40)
Total Protein: 6.6 g/dL (ref 6.0–8.5)

## 2021-11-22 LAB — SPECIMEN STATUS REPORT

## 2021-11-22 LAB — GAMMA GT: GGT: 16 IU/L (ref 0–65)

## 2021-12-03 ENCOUNTER — Other Ambulatory Visit: Payer: Self-pay | Admitting: Internal Medicine

## 2021-12-03 ENCOUNTER — Encounter: Payer: Self-pay | Admitting: Internal Medicine

## 2021-12-03 DIAGNOSIS — F419 Anxiety disorder, unspecified: Secondary | ICD-10-CM

## 2021-12-03 MED ORDER — ESCITALOPRAM OXALATE 10 MG PO TABS: ORAL_TABLET | ORAL | 2 refills | Status: DC

## 2022-01-30 ENCOUNTER — Other Ambulatory Visit: Payer: Self-pay | Admitting: Internal Medicine

## 2022-04-18 ENCOUNTER — Ambulatory Visit: Payer: 59 | Admitting: Internal Medicine

## 2022-04-23 ENCOUNTER — Ambulatory Visit: Payer: 59 | Admitting: Internal Medicine

## 2022-05-30 ENCOUNTER — Other Ambulatory Visit: Payer: Self-pay

## 2022-05-30 ENCOUNTER — Ambulatory Visit: Payer: 59 | Admitting: Internal Medicine

## 2022-05-30 ENCOUNTER — Encounter: Payer: Self-pay | Admitting: Internal Medicine

## 2022-05-30 VITALS — BP 106/82 | HR 49 | Temp 97.4°F | Ht 71.0 in | Wt 202.6 lb

## 2022-05-30 DIAGNOSIS — F419 Anxiety disorder, unspecified: Secondary | ICD-10-CM

## 2022-05-30 DIAGNOSIS — Z8249 Family history of ischemic heart disease and other diseases of the circulatory system: Secondary | ICD-10-CM | POA: Diagnosis not present

## 2022-05-30 DIAGNOSIS — Z23 Encounter for immunization: Secondary | ICD-10-CM

## 2022-05-30 DIAGNOSIS — F5101 Primary insomnia: Secondary | ICD-10-CM

## 2022-05-30 DIAGNOSIS — E7800 Pure hypercholesterolemia, unspecified: Secondary | ICD-10-CM

## 2022-05-30 LAB — LIPID PANEL
Chol/HDL Ratio: 1.8 ratio (ref 0.0–5.0)
Cholesterol, Total: 142 mg/dL (ref 100–199)
HDL: 78 mg/dL (ref >39)
LDL Chol Calc (NIH): 57 mg/dL (ref 0–99)
Triglycerides: 24 mg/dL (ref 0–149)
VLDL Cholesterol Cal: 7 mg/dL (ref 5–40)

## 2022-05-30 LAB — CMP14+EGFR
ALT: 45 IU/L — ABNORMAL HIGH (ref 0–44)
AST: 31 IU/L (ref 0–40)
Albumin/Globulin Ratio: 2.6 — ABNORMAL HIGH (ref 1.2–2.2)
Albumin: 4.7 g/dL (ref 4.1–5.1)
Alkaline Phosphatase: 75 IU/L (ref 44–121)
BUN/Creatinine Ratio: 12 (ref 9–20)
BUN: 14 mg/dL (ref 6–24)
Bilirubin Total: 0.5 mg/dL (ref 0.0–1.2)
CO2: 21 mmol/L (ref 20–29)
Calcium: 9.6 mg/dL (ref 8.7–10.2)
Chloride: 103 mmol/L (ref 96–106)
Creatinine, Ser: 1.2 mg/dL (ref 0.76–1.27)
Globulin, Total: 1.8 g/dL (ref 1.5–4.5)
Glucose: 96 mg/dL (ref 70–99)
Potassium: 4.4 mmol/L (ref 3.5–5.2)
Sodium: 141 mmol/L (ref 134–144)
Total Protein: 6.5 g/dL (ref 6.0–8.5)
eGFR: 74 mL/min/{1.73_m2} (ref >59)

## 2022-05-30 MED ORDER — ESCITALOPRAM OXALATE 10 MG PO TABS: ORAL_TABLET | ORAL | 2 refills | Status: DC

## 2022-05-30 MED ORDER — TEMAZEPAM 15 MG PO CAPS: ORAL_CAPSULE | ORAL | 1 refills | Status: DC

## 2022-05-30 MED ORDER — ROSUVASTATIN CALCIUM 5 MG PO TABS: 5.0000 mg | ORAL_TABLET | Freq: Every day | ORAL | 3 refills | Status: DC

## 2022-05-30 NOTE — Progress Notes (Signed)
I,Victoria T Hamilton,acting as a scribe for Gwynneth Aliment, MD.,have documented all relevant documentation on the behalf of Gwynneth Aliment, MD,as directed by  Gwynneth Aliment, MD while in the presence of Gwynneth Aliment, MD.    Subjective:     Patient ID: Curtis [name] , male    DOB: 31-Jan-1971 , 52 y.o.   MRN: [id number]   Chief Complaint  Patient presents with   Hyperlipidemia    HPI  The patient is here today for a cholesterol and anxiety f/u. He reports compliance with his meds. He does not have any questions or concerns at this time.  He would like refill on temazepam.   Hyperlipidemia This is a chronic problem. The problem is controlled. Pertinent negatives include no chest pain or leg pain. Current antihyperlipidemic treatment includes statins. The current treatment provides moderate improvement of lipids.  Anxiety Presents for follow-up visit. Patient reports no chest pain, nervous/anxious behavior, palpitations or panic. The quality of sleep is fair.   Compliance with medications is 76-100%.     Past Medical History:  Diagnosis Date   Acne    Allergic rhinitis    GERD (gastroesophageal reflux disease)    Insomnia    Olecranon bursitis    Pure hypercholesterolemia    Vitamin D deficiency      Family History  Problem Relation Age of Onset   Healthy Mother    Hypertension Father    High Cholesterol Father    CAD Father    Healthy Brother      Current Outpatient Medications:    Cholecalciferol (VITAMIN D) 2000 units CAPS, Take 1 capsule by mouth daily., Disp: , Rfl:    cyclobenzaprine (FLEXERIL) 10 MG tablet, Take 1 tablet (10 mg total) by mouth 3 (three) times daily as needed for muscle spasms., Disp: 30 tablet, Rfl: 0   levocetirizine (XYZAL) 5 MG tablet, Take 1 tablet (5 mg total) by mouth every evening., Disp: 90 tablet, Rfl: 2   meloxicam (MOBIC) 15 MG tablet, TAKE 1 TABLET(15 MG) BY MOUTH DAILY AS NEEDED FOR PAIN, Disp: 60 tablet, Rfl: 0   Omega-3  Fatty Acids (FISH OIL) 500 MG CAPS, Take 1 capsule by mouth daily., Disp: , Rfl:    escitalopram (LEXAPRO) 10 MG tablet, TAKE 1 TABLET DAILY, Disp: 90 tablet, Rfl: 2   rosuvastatin (CRESTOR) 5 MG tablet, Take 1 tablet (5 mg total) by mouth daily., Disp: 90 tablet, Rfl: 3   temazepam (RESTORIL) 15 MG capsule, TAKE 1 CAPSULE BY MOUTH AT  BEDTIME AS NEEDED FOR SLEEP, Disp: 60 capsule, Rfl: 1   No Known Allergies   Review of Systems  Constitutional: Negative.   Respiratory: Negative.    Cardiovascular: Negative.  Negative for chest pain and palpitations.  Endocrine: Negative.   Musculoskeletal: Negative.   Skin: Negative.   Allergic/Immunologic: Negative.   Hematological: Negative.   Psychiatric/Behavioral:  The patient is not nervous/anxious.      Today's Vitals   05/30/22 0832  BP: 106/82  Pulse: (!) 49  Temp: (!) 97.4 F (36.3 C)  SpO2: 98%  Weight: 202 lb 9.6 oz (91.9 kg)  Height: 5\' 11"  (1.803 m)   Body mass index is 28.26 kg/m.  Wt Readings from Last 3 Encounters:  05/30/22 202 lb 9.6 oz (91.9 kg)  10/18/21 202 lb 6.4 oz (91.8 kg)  08/22/21 205 lb 8 oz (93.2 kg)    Objective:  Physical Exam Vitals and nursing note reviewed.  Constitutional:  Appearance: Normal appearance.  HENT:     Head: Normocephalic and atraumatic.  Eyes:     Extraocular Movements: Extraocular movements intact.  Cardiovascular:     Rate and Rhythm: Normal rate and regular rhythm.     Heart sounds: Normal heart sounds.  Pulmonary:     Effort: Pulmonary effort is normal.     Breath sounds: Normal breath sounds.  Musculoskeletal:     Cervical back: Normal range of motion.  Skin:    General: Skin is warm.  Neurological:     General: No focal deficit present.     Mental Status: He is alert.  Psychiatric:        Mood and Affect: Mood normal.     Assessment And Plan:     1. Pure hypercholesterolemia Comments: Chronic, currently on rosuvastatin 5mg  daily. We also discussed his family  history of heart disease. Agrees to Cardiology referral for risk assessment. - CMP14+EGFR - Lipid panel - Ambulatory referral to Cardiology  2. Anxiety Comments: Chronic, stable w/ escitalopram 10mg  daily. Refills sent. He wil f/u in six months at next CPE.  3. Primary insomnia Comments: Chronic, he was given refill of temazepam to take nightly prn. He is encouraged to follow a bedtime routine.  4. Need for shingles vaccine - Zoster Recombinant (Shingrix )  5. Family history of heart disease Comments: Again, his father has h/o MI < 50. - Ambulatory referral to Cardiology   Patient was given opportunity to ask questions. Patient verbalized understanding of the plan and was able to repeat key elements of the plan. All questions were answered to their satisfaction.   I, Gwynneth Aliment, MD, have reviewed all documentation for this visit. The documentation on 05/30/22 for the exam, diagnosis, procedures, and orders are all accurate and complete.   IF YOU HAVE BEEN REFERRED TO A SPECIALIST, IT MAY TAKE 1-2 WEEKS TO SCHEDULE/PROCESS THE REFERRAL. IF YOU HAVE NOT HEARD FROM US/SPECIALIST IN TWO WEEKS, PLEASE GIVE [place] A CALL AT [phone].   THE PATIENT IS ENCOURAGED TO PRACTICE SOCIAL DISTANCING DUE TO THE COVID-19 PANDEMIC.

## 2022-05-30 NOTE — Patient Instructions (Signed)

## 2022-06-01 ENCOUNTER — Encounter: Payer: Self-pay | Admitting: Internal Medicine

## 2022-08-05 ENCOUNTER — Encounter (HOSPITAL_BASED_OUTPATIENT_CLINIC_OR_DEPARTMENT_OTHER): Payer: Self-pay | Admitting: Cardiology

## 2022-08-05 ENCOUNTER — Ambulatory Visit (HOSPITAL_BASED_OUTPATIENT_CLINIC_OR_DEPARTMENT_OTHER): Payer: 59 | Admitting: Cardiology

## 2022-08-05 VITALS — BP 124/72 | Ht 73.0 in | Wt 206.0 lb

## 2022-08-05 DIAGNOSIS — Z8249 Family history of ischemic heart disease and other diseases of the circulatory system: Secondary | ICD-10-CM | POA: Diagnosis not present

## 2022-08-05 DIAGNOSIS — Z7189 Other specified counseling: Secondary | ICD-10-CM

## 2022-08-05 DIAGNOSIS — E7800 Pure hypercholesterolemia, unspecified: Secondary | ICD-10-CM

## 2022-08-05 NOTE — Patient Instructions (Signed)
Medication Instructions:  Continue current medications  *If you need a refill on your cardiac medications before your next appointment, please call your pharmacy*   Lab Work: LP(a)  If you have labs (blood work) drawn today and your tests are completely normal, you will receive your results only by: MyChart Message (if you have MyChart) OR A paper copy in the mail If you have any lab test that is abnormal or we need to change your treatment, we will call you to review the results.   Testing/Procedures: None Ordered   Follow-Up: At [facility], you and your health needs are our priority.  As part of our continuing mission to provide you with exceptional heart care, we have created designated Provider Care Teams.  These Care Teams include your primary Cardiologist (physician) and Advanced Practice Providers (APPs -  Physician Assistants and Nurse Practitioners) who all work together to provide you with the care you need, when you need it.  We recommend signing up for the patient portal called "MyChart".  Sign up information is provided on this After Visit Summary.  MyChart is used to connect with patients for Virtual Visits (Telemedicine).  Patients are able to view lab/test results, encounter notes, upcoming appointments, etc.  Non-urgent messages can be sent to your provider as well.   To learn more about what you can do with MyChart, go to [email or web address].    Your next appointment:   2 year(s)  Provider:   Jodelle Red, MD    Other Instructions

## 2022-08-05 NOTE — Progress Notes (Signed)
Cardiology Office Note:  .    Date:  08/05/2022  ID:  Curtis [name], DOB [birth year], MRN [id number] PCP: Dorothyann Peng, MD  [facility] Providers Cardiologist:  Jodelle Red, MD     History of Present Illness: .    Curtis [name] is a 52 y.o. male with a hx of hyperlipidemia, anxiety, here for the evaluation of hyperlipidemia with family history of heart disease.  Seen by his PCP 05/30/2022. On rosuvastatin 5 mg daily for hyperlipidemia. Lipid panel that same day showed LDL 57, HDL 78, and triglycerides 24. He reported that his father had MI prior to age 24 and agreed to a cardiology referral for risk assessment. Chronic anxiety was stable on escitalopram 10 mg daily.  Cardiovascular risk factors: Prior clinical ASCVD: None. Comorbid conditions:  Hyperlipidemia - Previously his LDL was 100-110. He began taking statin therapy preventatively, controlled with rosuvastatin 5 mg daily. Started taking it every 3 days MWF because his cholesterol became too low; he had been tolerating daily use of statins. Metabolic syndrome/Obesity:  Current weight 206 lbs. Chronic inflammatory conditions: None. Tobacco use history: Never. Family history: His father had an MI at age 59, did have angioplasty but not certain if a stent was placed; father is a known smoker, no issues since.   Prior pertinent testing and/or incidental findings: Exercise level: Exercises routinely with lifting weights, not as much cardio as he should. May use the elliptical occasionally. He denies any anginal symptoms, significant shortness of breath, or syncope. No palpitations. Current diet: Depends, varies significantly. On the road frequently for work.   He denies any peripheral edema, lightheadedness, headaches, orthopnea, or PND.  ROS:  Please see the history of present illness. ROS otherwise negative except as noted.   Studies Reviewed: Marland Kitchen    EKG Interpretation Date/Time:  Monday August 05 2022 09:46:40  EDT Ventricular Rate:  52 PR Interval:  144 QRS Duration:  100 QT Interval:  416 QTC Calculation: 386 R Axis:   -33  Text Interpretation: Sinus bradycardia No previous ECGs available Confirmed by Jodelle Red [phone]) on 08/05/2022 10:30:14 AM      Physical Exam:    VS:  BP 124/72   Ht 6\' 1"  (1.854 m)   Wt 206 lb (93.4 kg)   SpO2 97%   BMI 27.18 kg/m    Wt Readings from Last 3 Encounters:  08/05/22 206 lb (93.4 kg)  05/30/22 202 lb 9.6 oz (91.9 kg)  10/18/21 202 lb 6.4 oz (91.8 kg)    GEN: Well nourished, well developed in no acute distress HEENT: Normal, moist mucous membranes NECK: No JVD CARDIAC: regular rhythm, normal S1 and S2, no rubs or gallops. No murmur. VASCULAR: Radial and DP pulses 2+ bilaterally. No carotid bruits RESPIRATORY:  Clear to auscultation without rales, wheezing or rhonchi  ABDOMEN: Soft, non-tender, non-distended MUSCULOSKELETAL:  Ambulates independently SKIN: Warm and dry, no edema NEUROLOGIC:  Alert and oriented x 3. No focal neuro deficits noted. PSYCHIATRIC:  Normal affect   ASSESSMENT AND PLAN: .    Family history of premature CAD -father with MI age 63, smoker. No stent or CABG, no further issues -on rosuvastatin (currently taking 3x/week) for primary prevention -he is outside the guidelines for calcium score as he is already on statin, but we did discuss today. Better option would be coronary CT angiography for plaque volume once they are available for primary prevention/asymptomatic patients -check lp(a), discussed PCSK9i if elevated -reviewed red flag warning signs that need immediate medical  attention  CV risk counseling and prevention -recommend heart healthy/Mediterranean diet, with whole grains, fruits, vegetable, fish, lean meats, nuts, and olive oil. Limit salt. -recommend moderate walking, 3-5 times/week for 30-50 minutes each session. Aim for at least 150 minutes.week. Goal should be pace of 3 miles/hours, or walking 1.5  miles in 30 minutes -recommend avoidance of tobacco products. Avoid excess alcohol. -ASCVD risk score: The 10-year ASCVD risk score (Arnett DK, et al., 2019) is: 1.3%   Values used to calculate the score:     Age: 89 years     Sex: Male     Is Non-Hispanic African American: No     Diabetic: No     Tobacco smoker: No     Systolic Blood Pressure: 124 mmHg     Is BP treated: No     HDL Cholesterol: 78 mg/dL     Total Cholesterol: 142 mg/dL   Dispo: Follow-up in 2 years, or sooner as needed.  I,Mathew Stumpf,acting as a Neurosurgeon for [employer], MD.,have documented all relevant documentation on the behalf of Jodelle Red, MD,as directed by  Jodelle Red, MD while in the presence of Jodelle Red, MD.  I, Jodelle Red, MD, have reviewed all documentation for this visit. The documentation on 08/05/22 for the exam, diagnosis, procedures, and orders are all accurate and complete.   Signed, Jodelle Red, MD

## 2022-08-06 LAB — LIPOPROTEIN A (LPA): Lipoprotein (a): 14.3 nmol/L (ref <75.0)

## 2022-08-27 NOTE — Progress Notes (Unsigned)
PATIENT: Curtis [name] DOB: April 10, 1970  REASON FOR VISIT: follow up HISTORY FROM: patient  No chief complaint on file.    HISTORY OF PRESENT ILLNESS:  08/27/22 ALL: Curtis [name] returns for follow up for central sleep apnea treated with CPAP therapy.   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 He continues CPAP therapy and doing very well. He denies concerns with machine or supplies. He wakes feeling refreshed. No difficulty sleeping.      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 He was previously taking trazodone 50mg  but switched to temazepam 15mg  by PCP and escitalopram 10mg  daily. He feels that he is doing very well. He is sleeping better. He is using CPAP nightly. He did have an issue with his tubing that prevented usage a couple of nights last month but he usually uses CPAP every night. No concerns with [employer].     08/23/2019 ALL:  Curtis [name] is a 52 y.o. male here today for follow up for central sleep apnea treated with CPAP and insomnia on trazodone 50mg  daily. He reports that he is doing well. He is using CPAP nightly. Trazodone has helped him stay asleep longer. He seems to wake most days around 4-5am. He also takes melatonin every night and occasionally will take temazepam prescribed by PCP. He is feeling well today and without concerns.   Compliance report dated 07/20/2019 through 08/18/2019 reveals that he has used CPAP 29 of the past 30 days for compliance of 97%.  He used CPAP greater than 4 hours 27 of the past 30 days for compliance of 90%.  Average usage was 7 hours and 45 minutes.  Residual AHI was 2.0 on 5 to 9 cm of water and an EPR of 2.  There was no leak noted.  HISTORY: (copied from Dr Dohmeier's note on 02/22/2019)  RV 02-22-2019, I have the pleasure of meeting with Curtis [name] today, an established complex sleep apnea patient ,who also has insomnia. While his primary central  sleep apnea has been controlled he still has difficulties to  sleep through the night. His compliance on his air sense 10 machine is excellent he uses a very small pressure window between 5 and 9 cmH2O this 2 cm expiratory pressure relief, average use at time 6 hours 25 minutes and he has a 93% compliance by days.  His residual AHI is 2.3 and the residual apneas are mostly central at 1.5/h however this is a good resolution of apnea.  She endorsed the fatigue severity scale 24 points out of 63.  He endorsed the Epworth sleepiness score at only 4 out of 24 points. He goes to sleep around 10 10:30 PM but he has problems with waking up early at around 4 AM but he does not need to get up and it is not desired to get up.  However there are also days and weeks where he can catch 7 hours of sleep alternating with days and nights of 5 or 5-1/2 hours of sleep.  The early morning awakening may be related to low serotonin levels.  I will be happy to discuss cyclic sleep disorders here today.   02-18-2018,  RV after 2 sleep studies.  Curtis [name] is a 52 y.o. caucasian, right handed  male patient and was seen in a referral from Dr. Allyne Gee for a sleep medicine consultation.  Curtis [name] underwent a baseline polysomnography with an expanded EEG montage  of 29 September 2017 which ended up in a surprising result he had by far dominating central apneas of obstructive apneas.  58 central apneas were seen 10 obstructive 7 2 mixed as well as 44 hypopneas.  The total AHI was 17.5 in the mild-to-moderate range of the RDI was not higher.  During REM sleep there was a less apnea which is a typical distribution for a central apnea patient.  In supine sleep there were 28.5 versus nonsupine 6.0/h.  He also had sinus bradycardia which can be also related to his athletic activities.  Primary snoring was noted and be due to the central apnea nature we asked him to return for an attended CPAP titration which took place on 7 October.  The AHI was  reduced to 5.2 overall at a final pressure of 8 cmH2O there was a reduction of 0.0 AHI and 100% sleep efficiency.  The technologist gave the patient a Simplus mask which is a fullface model in large size.  He is now using an auto titration CPAP with a pressure range between 5 and 9 cmH2O and one 1 cm EPR his compliance was 97% by days and 25 of those days were over 4 hours of daily use with an average user time of 6 hours and 8 minutes.  The residual AHI is 5.0 and the residual apneas are still central in nature.  However we manage not to provoke more central apneas.   He feels as if he is still not sleeping super long, but his sleep has improved, his sleep quality is more restorative. Bradycardia. He trains 4 days a week, cardio and weight lifting.    Epworth changed on CPAP to 2 points and FSS to 13 points.      HPI: Chief complaint according to patient : "Increasingly fatigued, and sleepy. I have wake up early and can't go back to sleep ". He works for [employer] as a Psychologist, counselling. Curtis [name] reports that he has noted certain cycles or phases of sleep changes.  There will be times where he wakes up at the exact time every morning for example 3; 40 minutes AM and after a couple of weeks or so this again will change. It has never been difficult for him to go to sleep in the first place but if he goes to early he also wakes up early.  He craves to be able to sleep 7 or 8 hours but this has rarely happened. He hasn't slept in daytime , has never been a "napper".   Sleep habits are as follows: The patient aims for a bedtime between 10 and 11 PM, if he would go to sleep earlier he would wake up earlier and he always avoids going to sleep later than midnight. He feels pretty good at this routine. Bedroom is cool , quiet and dark. He shares the bedroom with his wife, but he feels usually better when alone in bed. He sleeps poorly in hotels.  Kids are 8 and 11, no pets in the bedroom. Bathroom break will wake him up  some days, mostly around 3 or 4 AM.  He has vivid dreams- but feels these don't wake him, no pain or discomfort that interrupts his sleep. Used to have some low back pain that has resolved.   No enactment, no sleep . Wife reports snoring, mildly and more after alcohol ( weekends ) and only on his back.  He wakes at 6. 30 AM spontaneously. He is always able to  wake without alarm. He feels refreshed some days, only after at least 6 hours of sleep. Often he is fatigued.    Sleep medical history and family sleep history: father was a snorer, lost weight- CPAP user. Mother is a Agricultural consultant. One brother who is  younger by 3.5 years sleeps well.     Social history: he practices good sleep hygiene. He doesn't have electronics in the bedroom- married, 2 children in school age. Sales rep , travels by car.  Non smoker, exerciser, ETOH- wine or beer on weekends, 2-3 beers per WE evening, caffeine - no soda, 2 cups of coffee in AM, sweet tea at lunch.      REVIEW OF SYSTEMS: Out of a complete 14 system review of symptoms, the patient complains only of the following symptoms, insomnia and all other reviewed systems are negative.  ESS: 1   ALLERGIES: No Known Allergies  HOME MEDICATIONS: Outpatient Medications Prior to Visit  Medication Sig Dispense Refill   Cholecalciferol (VITAMIN D) 2000 units CAPS Take 1 capsule by mouth daily.     cyclobenzaprine (FLEXERIL) 10 MG tablet Take 1 tablet (10 mg total) by mouth 3 (three) times daily as needed for muscle spasms. 30 tablet 0   escitalopram (LEXAPRO) 10 MG tablet TAKE 1 TABLET DAILY 90 tablet 2   levocetirizine (XYZAL) 5 MG tablet Take 5 mg by mouth as needed for allergies.     meloxicam (MOBIC) 15 MG tablet TAKE 1 TABLET(15 MG) BY MOUTH DAILY AS NEEDED FOR PAIN 60 tablet 0   Omega-3 Fatty Acids (FISH OIL) 500 MG CAPS Take 1 capsule by mouth daily.     rosuvastatin (CRESTOR) 5 MG tablet Take 1 tablet (5 mg total) by mouth daily. (Patient taking  differently: Take 5 mg by mouth 3 (three) times a week.) 90 tablet 3   temazepam (RESTORIL) 15 MG capsule TAKE 1 CAPSULE BY MOUTH AT  BEDTIME AS NEEDED FOR SLEEP 60 capsule 1   No facility-administered medications prior to visit.    PAST MEDICAL HISTORY: Past Medical History:  Diagnosis Date   Acne    Allergic rhinitis    GERD (gastroesophageal reflux disease)    Insomnia    Olecranon bursitis    Pure hypercholesterolemia    Vitamin D deficiency     PAST SURGICAL HISTORY: Past Surgical History:  Procedure Laterality Date   INGUINAL HERNIA REPAIR Right    WISDOM TOOTH EXTRACTION      FAMILY HISTORY: Family History  Problem Relation Age of Onset   Healthy Mother    Hypertension Father    High Cholesterol Father    CAD Father    Healthy Brother     SOCIAL HISTORY: Social History   Socioeconomic History   Marital status: Married    Spouse name: Not on file   Number of children: Not on file   Years of education: Not on file   Highest education level: Professional school degree (e.g., MD, DDS, DVM, JD)  Occupational History   Not on file  Tobacco Use   Smoking status: Never   Smokeless tobacco: Never  Vaping Use   Vaping status: Never Used  Substance and Sexual Activity   Alcohol use: Yes    Alcohol/week: 6.0 standard drinks of alcohol    Types: 6 Glasses of wine per week   Drug use: Never   Sexual activity: Not on file  Other Topics Concern   Not on file  Social History Narrative   Not on  file   Social Determinants of Health   Financial Resource Strain: Low Risk  (05/27/2022)   Overall Financial Resource Strain (CARDIA)    Difficulty of Paying Living Expenses: Not hard at all  Food Insecurity: No Food Insecurity (05/27/2022)   Hunger Vital Sign    Worried About Running Out of Food in the Last Year: Never true    Ran Out of Food in the Last Year: Never true  Transportation Needs: No Transportation Needs (05/27/2022)   PRAPARE - Scientist, research (physical sciences) (Medical): No    Lack of Transportation (Non-Medical): No  Physical Activity: Sufficiently Active (05/27/2022)   Exercise Vital Sign    Days of Exercise per Week: 5 days    Minutes of Exercise per Session: 70 min  Stress: No Stress Concern Present (05/27/2022)   [employer] of Occupational Health - Occupational Stress Questionnaire    Feeling of Stress : Only a little  Social Connections: Socially Integrated (05/27/2022)   Social Connection and Isolation Panel [NHANES]    Frequency of Communication with Friends and Family: More than three times a week    Frequency of Social Gatherings with Friends and Family: More than three times a week    Attends Religious Services: More than 4 times per year    Active Member of [employer] or Organizations: Yes    Attends Banker Meetings: More than 4 times per year    Marital Status: Married  Catering manager Violence: Low Risk  (05/20/2019)   Received from [facility]   Intimate Partner Violence    Insults You: Not on file    Threatens You: Not on file    Screams at You: Not on file    Physically Hurt: Not on file    Intimate Partner Violence Score: Not on file      PHYSICAL EXAM  There were no vitals filed for this visit.    There is no height or weight on file to calculate BMI.  Generalized: Well developed, in no acute distress  Cardiology: normal rate and rhythm, no murmur noted Respiratory: clear to auscultation bilaterally  Neurological examination  Mentation: Alert oriented to time, place, history taking. Follows all commands speech and language fluent Cranial nerve II-XII: Pupils were equal round reactive to light. Extraocular movements were full, visual field were full  Motor: The motor testing reveals 5 over 5 strength of all 4 extremities. Good symmetric motor tone is noted throughout.   Gait and station: Gait is normal.   DIAGNOSTIC DATA (LABS, IMAGING, TESTING) - I reviewed patient records,  labs, notes, testing and imaging myself where available.      No data to display           Lab Results  Component Value Date   WBC 3.5 10/18/2021   HGB 15.7 10/18/2021   HCT 44.6 10/18/2021   MCV 93 10/18/2021   PLT 185 10/18/2021      Component Value Date/Time   NA 141 05/30/2022 0907   K 4.4 05/30/2022 0907   CL 103 05/30/2022 0907   CO2 21 05/30/2022 0907   GLUCOSE 96 05/30/2022 0907   BUN 14 05/30/2022 0907   CREATININE 1.20 05/30/2022 0907   CALCIUM 9.6 05/30/2022 0907   PROT 6.5 05/30/2022 0907   ALBUMIN 4.7 05/30/2022 0907   AST 31 05/30/2022 0907   ALT 45 (H) 05/30/2022 0907   ALKPHOS 75 05/30/2022 0907   BILITOT 0.5 05/30/2022 0907   GFRNONAA 75  09/29/2019 0948   GFRAA 86 09/29/2019 0948   Lab Results  Component Value Date   CHOL 142 05/30/2022   HDL 78 05/30/2022   LDLCALC 57 05/30/2022   TRIG 24 05/30/2022   CHOLHDL 1.8 05/30/2022   Lab Results  Component Value Date   HGBA1C 5.1 09/07/2018   No results found for: "VITAMINB12" No results found for: "TSH"     ASSESSMENT AND PLAN 52 y.o. year old male  has a past medical history of Acne, Allergic rhinitis, GERD (gastroesophageal reflux disease), Insomnia, Olecranon bursitis, Pure hypercholesterolemia, and Vitamin D deficiency. here with   No diagnosis found.   Curtis [name] is doing well on CPAP therapy.  Compliance report reveals excellent compliance.  He was encouraged to continue using CPAP nightly and for greater than 4 hours each night. Insomnia is well managed. We will continue this medication. Healthy lifestyle habits encouraged.  He will follow-up with [place] in 1 year, sooner if needed.  He verbalizes understanding and agreement with this plan.   No orders of the defined types were placed in this encounter.     No orders of the defined types were placed in this encounter.      Shawnie Dapper, FNP-C 08/27/2022, 11:05 AM [facility] [address] [place], [place]  [address] [phone]

## 2022-08-27 NOTE — Patient Instructions (Signed)

## 2022-08-28 ENCOUNTER — Encounter: Payer: Self-pay | Admitting: Family Medicine

## 2022-08-28 ENCOUNTER — Ambulatory Visit: Payer: 59 | Admitting: Family Medicine

## 2022-08-28 VITALS — BP 124/84 | HR 65 | Ht 73.0 in | Wt 206.0 lb

## 2022-08-28 DIAGNOSIS — G4731 Primary central sleep apnea: Secondary | ICD-10-CM | POA: Diagnosis not present

## 2022-08-28 DIAGNOSIS — G4730 Sleep apnea, unspecified: Secondary | ICD-10-CM | POA: Diagnosis not present

## 2022-09-17 ENCOUNTER — Ambulatory Visit: Payer: 59 | Admitting: Neurology

## 2022-09-17 DIAGNOSIS — G4733 Obstructive sleep apnea (adult) (pediatric): Secondary | ICD-10-CM

## 2022-09-17 DIAGNOSIS — F5101 Primary insomnia: Secondary | ICD-10-CM

## 2022-09-17 DIAGNOSIS — R0683 Snoring: Secondary | ICD-10-CM

## 2022-09-17 DIAGNOSIS — G4730 Sleep apnea, unspecified: Secondary | ICD-10-CM

## 2022-09-17 DIAGNOSIS — G4731 Primary central sleep apnea: Secondary | ICD-10-CM

## 2022-09-17 DIAGNOSIS — F419 Anxiety disorder, unspecified: Secondary | ICD-10-CM

## 2022-09-18 ENCOUNTER — Encounter: Payer: Self-pay | Admitting: Family Medicine

## 2022-09-18 ENCOUNTER — Other Ambulatory Visit: Payer: Self-pay | Admitting: Family Medicine

## 2022-09-18 DIAGNOSIS — G4731 Primary central sleep apnea: Secondary | ICD-10-CM

## 2022-09-18 NOTE — Progress Notes (Signed)
[facility]  Curtis [name] " [facility] SLEEP TEST REPORT ( by Watch PAT)   STUDY DATE:  09-18-2022 DOB: 1970/07/06  MRN: [id number]    ORDERING CLINICIAN: Shawnie Dapper, NP  REFERRING CLINICIAN: Dorothyann Peng, MD   CLINICAL INFORMATION/HISTORY: [name], NP autotitration PAP user with a setting between 5 and 9 cm water , residual AHI of 3/h, higher central apneas among  residual apnea.  This home sleep test was supposedly ordered to allow a new CPAP prescription. 08/28/22 ALL: [name] returns for follow up for central sleep apnea treated with CPAP therapy. He continues to do well. He is using therapy nightly for about 8-9 hours. He does continue to have intermittent sleep maintenance insomnia. He continues temazepam per PCP. No concerns with CPAP machine or supplies. He is eligible for a new machine.  08/22/2020 ALL: [name] returns for follow up for central sleep apnea on CPAP and insomnia. He was previously taking trazodone 50mg  but switched to temazepam 15mg  by PCP and escitalopram 10mg  daily. He feels that he is doing very well. He is sleeping better. He is using CPAP nightly. He did have an issue with his tubing that prevented usage a couple of nights last month but he usually uses CPAP every night. No concerns with [employer].         Epworth sleepiness score: 1/24. FSS not noted, Neck size not noted.    BMI:  27.5 kg/m   Neck Circumference: na   FINDINGS:   Sleep Summary:   Total Recording Time (hours, min):   9 hours 27 minutes  Total Sleep Time (hours, min):    8 hours 5 minutes             Percent REM (%):       20.4%                                 Respiratory Indices following AASM criteria:   Calculated pAHI (per hour):   56/h      , 8.6/h central apneas                    REM pAHI: 41.3/h                                               NREM pAHI:    60/h                          Positional AHI: The patient slept 260 minutes supine with  an AHI of 71.8/h versus prone with an AHI AHI of 37.3/h.  Snoring reached a mean volume of 41 dB and was present for 35% of total sleep time.                                                  Oxygen Saturation Statistics:   O2 Saturation Range (%): Between the nadir at 84% with a maximum of 100% with a mean saturation at 94%.  O2 Saturation (minutes) <89%:    0.3 minutes       Pulse Rate Statistics:   Pulse Mean (bpm):    49 bpm             Pulse Range:      Between 38 and 86 bpm, this patient is baseline bradycardic.           IMPRESSION:  This HST confirms the presence of severe sleep apnea dominantly obstructive in origin.  There was no prolonged hypoxia noted but bradycardia was present throughout.  This patient needs to continue with positive airway pressure therapy as already established.    He should receive a new machine with similar settings to his previous one and will follow-up with Amy [name] within 90 days of therapy.   RECOMMENDATION: auto-CPAP device with currently used mask, heated humidifier and settings from 5-9 cm water with 2 cm EPR.     INTERPRETING PHYSICIAN:   Melvyn Novas, MD   [facility] IS AN AASM ACCREDITED INSTITUTION since inception.   Certified in Neurology by ABPN Certified in Sleep Medicine by ABSM

## 2022-09-23 NOTE — Procedures (Signed)
[facility]  Hommer Sivertsen " [facility] SLEEP TEST REPORT ( by Watch PAT)   STUDY DATE:  09-18-2022 DOB: Mar 01, 1970  MRN: [id number]    ORDERING CLINICIAN: Shawnie Dapper, Curtis [name]  REFERRING CLINICIAN: Dorothyann Peng, Curtis [name]   CLINICAL INFORMATION/HISTORY: [name], Curtis [name] autotitration PAP user with a setting between 5 and 9 cm water , residual AHI of 3/h, higher central apneas among  residual apnea.  This home sleep test was supposedly ordered to allow a new CPAP prescription. 08/28/22 ALL: Curtis [name] returns for follow up for central sleep apnea treated with CPAP therapy. He continues to do well. He is using therapy nightly for about 8-9 hours. He does continue to have intermittent sleep maintenance insomnia. He continues temazepam per PCP. No concerns with CPAP machine or supplies. He is eligible for a new machine.  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 He was previously taking trazodone 50mg  but switched to temazepam 15mg  by PCP and escitalopram 10mg  daily. He feels that he is doing very well. He is sleeping better. He is using CPAP nightly. He did have an issue with his tubing that prevented usage a couple of nights last month but he usually uses CPAP every night. No concerns with [employer].         Epworth sleepiness score: 1/24. FSS not noted, Neck size not noted.    BMI:  27.5 kg/m   Neck Circumference: na   FINDINGS:   Sleep Summary:   Total Recording Time (hours, min):   9 hours 27 minutes  Total Sleep Time (hours, min):    8 hours 5 minutes             Percent REM (%):       20.4%                                 Respiratory Indices following AASM criteria:   Calculated pAHI (per hour):   56/h      , 8.6/h central apneas                    REM pAHI: 41.3/h                                               NREM pAHI:    60/h                          Positional AHI: The patient slept 260 minutes supine with an AHI  of 71.8/h versus prone with an AHI AHI of 37.3/h.  Snoring reached a mean volume of 41 dB and was present for 35% of total sleep time.                                                  Oxygen Saturation Statistics:   O2 Saturation Range (%): Between the nadir at 84% with a maximum of 100% with a mean saturation at 94%.  O2 Saturation (minutes) <89%:    0.3 minutes       Pulse Rate Statistics:   Pulse Mean (bpm):    49 bpm             Pulse Range:      Between 38 and 86 bpm, this patient is baseline bradycardic.           IMPRESSION:  This HST confirms the presence of severe sleep apnea dominantly obstructive in origin.  There was no prolonged hypoxia noted but bradycardia was present throughout.  This patient needs to continue with positive airway pressure therapy as already established.    He should receive a new machine with similar settings to his previous one and will follow-up with Curtis [name] within 90 days of therapy.   RECOMMENDATION: auto-CPAP device with currently used mask, heated humidifier and settings from 5-9 cm water with 2 cm EPR.     INTERPRETING PHYSICIAN:   Curtis Novas, Curtis [name]   [facility] IS AN AASM ACCREDITED INSTITUTION since inception.   Certified in Neurology by ABPN Certified in Sleep Medicine by ABSM

## 2022-09-24 ENCOUNTER — Other Ambulatory Visit: Payer: Self-pay | Admitting: Family Medicine

## 2022-09-24 ENCOUNTER — Telehealth: Payer: Self-pay

## 2022-09-24 DIAGNOSIS — G4730 Sleep apnea, unspecified: Secondary | ICD-10-CM

## 2022-10-28 ENCOUNTER — Other Ambulatory Visit: Payer: Self-pay | Admitting: Internal Medicine

## 2022-10-29 ENCOUNTER — Encounter: Payer: 59 | Admitting: Internal Medicine

## 2022-12-04 NOTE — Patient Instructions (Signed)

## 2022-12-04 NOTE — Progress Notes (Unsigned)
PATIENT: Curtis [name] DOB: [birth year]  REASON FOR VISIT: follow up HISTORY FROM: patient  No chief complaint on file.    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 He repeated HST 09/2022 showing severe sleep apnea, predominantly obstructive in origin. New autoPAP ordered. He is doing well with new machine. He is using therapy nightly for about   Travel machine?  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 He continues to do well. He is using therapy nightly for about 8-9 hours. He does continue to have intermittent sleep maintenance insomnia. He continues temazepam per PCP. No concerns with CPAP machine or supplies. He is eligible for a new machine.     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 He continues CPAP therapy and doing very well. He denies concerns with machine or supplies. He wakes feeling refreshed. No difficulty sleeping.      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 He was previously taking trazodone 50mg  but switched to temazepam 15mg  by PCP and escitalopram 10mg  daily. He feels that he is doing very well. He is sleeping better. He is using CPAP nightly. He did have an issue with his tubing that prevented usage a couple of nights last month but he usually uses CPAP every night. No concerns with [employer].     08/23/2019 ALL:  Curtis [name] is a 52 y.o. male here [name] for follow up for central sleep apnea treated with CPAP and insomnia on trazodone 50mg  daily. He reports that he is doing well. He is using CPAP nightly. Trazodone has helped him stay asleep longer. He seems to wake most days around 4-5am. He also takes melatonin every night and occasionally will take temazepam prescribed by PCP. He is feeling well [name] and without concerns.   Compliance report dated 07/20/2019  through 08/18/2019 reveals that he has used CPAP 29 of the past 30 days for compliance of 97%.  He used CPAP greater than 4 hours 27 of the past 30 days for compliance of 90%.  Average usage was 7 hours and 45 minutes.  Residual AHI was 2.0 on 5 to 9 cm of water and an EPR of 2.  There was no leak noted.  HISTORY: (copied from Dr Dohmeier's note on 02/22/2019)  RV 02-22-2019, I have the pleasure of meeting with Curtis [name], an established complex sleep apnea patient ,who also has insomnia. While his primary central sleep apnea has been controlled he still has difficulties to  sleep through the night. His compliance on his air sense 10 machine is excellent he uses a very small pressure window between 5 and 9 cmH2O this 2 cm expiratory pressure relief, average use at time 6 hours 25 minutes and he has a 93% compliance by days.  His residual AHI is 2.3 and the residual apneas are mostly central at 1.5/h however this is a good resolution of apnea.  She endorsed the fatigue severity scale 24 points out of 63.  He endorsed the Epworth sleepiness score at only 4 out of 24 points. He goes to sleep around 10 10:30 PM but he has problems with waking up early at around 4 AM but he does not need to get up and it is not desired to get up.  However there are also days and weeks where  he can catch 7 hours of sleep alternating with days and nights of 5 or 5-1/2 hours of sleep.  The early morning awakening may be related to low serotonin levels.  I will be happy to discuss cyclic sleep disorders here [name].   02-18-2018,  RV after 2 sleep studies.  Curtis [name] is a 52 y.o. caucasian, right handed  male patient and was seen in a referral from Dr. Allyne Gee for a sleep medicine consultation.  [name] underwent a baseline polysomnography with an expanded EEG montage of 29 September 2017 which ended up in a surprising result he had by far dominating central apneas of obstructive apneas.  58 central apneas were seen 10  obstructive 7 2 mixed as well as 44 hypopneas.  The total AHI was 17.5 in the mild-to-moderate range of the RDI was not higher.  During REM sleep there was a less apnea which is a typical distribution for a central apnea patient.  In supine sleep there were 28.5 versus nonsupine 6.0/h.  He also had sinus bradycardia which can be also related to his athletic activities.  Primary snoring was noted and be due to the central apnea nature we asked him to return for an attended CPAP titration which took place on 7 October.  The AHI was reduced to 5.2 overall at a final pressure of 8 cmH2O there was a reduction of 0.0 AHI and 100% sleep efficiency.  The technologist gave the patient a Simplus mask which is a fullface model in large size.  He is now using an auto titration CPAP with a pressure range between 5 and 9 cmH2O and one 1 cm EPR his compliance was 97% by days and 25 of those days were over 4 hours of daily use with an average user time of 6 hours and 8 minutes.  The residual AHI is 5.0 and the residual apneas are still central in nature.  However we manage not to provoke more central apneas.   He feels as if he is still not sleeping super long, but his sleep has improved, his sleep quality is more restorative. Bradycardia. He trains 4 days a week, cardio and weight lifting.    Epworth changed on CPAP to 2 points and FSS to 13 points.      HPI: Chief complaint according to patient : "Increasingly fatigued, and sleepy. I have wake up early and can't go back to sleep ". He works for [employer] as a Psychologist, counselling. Curtis [name] reports that he has noted certain cycles or phases of sleep changes.  There will be times where he wakes up at the exact time every morning for example 3; 40 minutes AM and after a couple of weeks or so this again will change. It has never been difficult for him to go to sleep in the first place but if he goes to early he also wakes up early.  He craves to be able to sleep 7 or 8 hours but this  has rarely happened. He hasn't slept in daytime , has never been a "napper".   Sleep habits are as follows: The patient aims for a bedtime between 10 and 11 PM, if he would go to sleep earlier he would wake up earlier and he always avoids going to sleep later than midnight. He feels pretty good at this routine. Bedroom is cool , quiet and dark. He shares the bedroom with his wife, but he feels usually better when alone in bed. He sleeps poorly in  hotels.  Kids are 8 and 11, no pets in the bedroom. Bathroom break will wake him up some days, mostly around 3 or 4 AM.  He has vivid dreams- but feels these don't wake him, no pain or discomfort that interrupts his sleep. Used to have some low back pain that has resolved.   No enactment, no sleep . Wife reports snoring, mildly and more after alcohol ( weekends ) and only on his back.  He wakes at 6. 30 AM spontaneously. He is always able to wake without alarm. He feels refreshed some days, only after at least 6 hours of sleep. Often he is fatigued.    Sleep medical history and family sleep history: father was a snorer, lost weight- CPAP user. Mother is a Agricultural consultant. One brother who is  younger by 3.5 years sleeps well.     Social history: he practices good sleep hygiene. He doesn't have electronics in the bedroom- married, 2 children in school age. Sales rep , travels by car.  Non smoker, exerciser, ETOH- wine or beer on weekends, 2-3 beers per WE evening, caffeine - no soda, 2 cups of coffee in AM, sweet tea at lunch.      REVIEW OF SYSTEMS: Out of a complete 14 system review of symptoms, the patient complains only of the following symptoms, insomnia and all other reviewed systems are negative.  ESS: 1/24   ALLERGIES: No Known Allergies  HOME MEDICATIONS: Outpatient Medications Prior to Visit  Medication Sig Dispense Refill   Cholecalciferol (VITAMIN D) 2000 units CAPS Take 1 capsule by mouth daily.     cyclobenzaprine (FLEXERIL) 10 MG tablet  Take 1 tablet (10 mg total) by mouth 3 (three) times daily as needed for muscle spasms. 30 tablet 0   escitalopram (LEXAPRO) 10 MG tablet TAKE 1 TABLET DAILY 90 tablet 2   levocetirizine (XYZAL) 5 MG tablet Take 5 mg by mouth as needed for allergies.     meloxicam (MOBIC) 15 MG tablet TAKE 1 TABLET(15 MG) BY MOUTH DAILY AS NEEDED FOR PAIN 60 tablet 0   Omega-3 Fatty Acids (FISH OIL) 500 MG CAPS Take 1 capsule by mouth daily.     rosuvastatin (CRESTOR) 5 MG tablet Take 1 tablet (5 mg total) by mouth daily. (Patient taking differently: Take 5 mg by mouth 3 (three) times a week.) 90 tablet 3   temazepam (RESTORIL) 15 MG capsule TAKE 1 CAPSULE BY MOUTH AT  BEDTIME AS NEEDED FOR SLEEP 60 capsule 0   No facility-administered medications prior to visit.    PAST MEDICAL HISTORY: Past Medical History:  Diagnosis Date   Acne    Allergic rhinitis    GERD (gastroesophageal reflux disease)    Insomnia    Olecranon bursitis    Pure hypercholesterolemia    Vitamin D deficiency     PAST SURGICAL HISTORY: Past Surgical History:  Procedure Laterality Date   INGUINAL HERNIA REPAIR Right    WISDOM TOOTH EXTRACTION      FAMILY HISTORY: Family History  Problem Relation Age of Onset   Healthy Mother    Hypertension Father    High Cholesterol Father    CAD Father    Healthy Brother     SOCIAL HISTORY: Social History   Socioeconomic History   Marital status: Married    Spouse name: Not on file   Number of children: Not on file   Years of education: Not on file   Highest education level: Professional school degree (e.g., MD,  DDS, DVM, JD)  Occupational History   Not on file  Tobacco Use   Smoking status: Never   Smokeless tobacco: Never  Vaping Use   Vaping status: Never Used  Substance and Sexual Activity   Alcohol use: Yes    Alcohol/week: 6.0 standard drinks of alcohol    Types: 6 Glasses of wine per week   Drug use: Never   Sexual activity: Not on file  Other Topics Concern    Not on file  Social History Narrative   Not on file   Social Determinants of Health   Financial Resource Strain: Low Risk  (05/27/2022)   Overall Financial Resource Strain (CARDIA)    Difficulty of Paying Living Expenses: Not hard at all  Food Insecurity: No Food Insecurity (05/27/2022)   Hunger Vital Sign    Worried About Running Out of Food in the Last Year: Never true    Ran Out of Food in the Last Year: Never true  Transportation Needs: No Transportation Needs (05/27/2022)   PRAPARE - Administrator, Civil Service (Medical): No    Lack of Transportation (Non-Medical): No  Physical Activity: Sufficiently Active (05/27/2022)   Exercise Vital Sign    Days of Exercise per Week: 5 days    Minutes of Exercise per Session: 70 min  Stress: No Stress Concern Present (05/27/2022)   [employer] of Occupational Health - Occupational Stress Questionnaire    Feeling of Stress : Only a little  Social Connections: Socially Integrated (05/27/2022)   Social Connection and Isolation Panel [NHANES]    Frequency of Communication with Friends and Family: More than three times a week    Frequency of Social Gatherings with Friends and Family: More than three times a week    Attends Religious Services: More than 4 times per year    Active Member of [employer] or Organizations: Yes    Attends Banker Meetings: More than 4 times per year    Marital Status: Married  Catering manager Violence: Low Risk  (05/20/2019)   [facility] from [facility]   Intimate Partner Violence    Insults You: Not on file    Threatens You: Not on file    Screams at You: Not on file    Physically Hurt: Not on file    Intimate Partner Violence Score: Not on file      PHYSICAL EXAM  There were no vitals filed for this visit.     There is no height or weight on file to calculate BMI.  Generalized: Well developed, in no acute distress  Cardiology: normal rate and rhythm, no murmur  noted Respiratory: clear to auscultation bilaterally  Neurological examination  Mentation: Alert oriented to time, place, history taking. Follows all commands speech and language fluent Cranial nerve II-XII: Pupils were equal round reactive to light. Extraocular movements were full, visual field were full  Motor: The motor testing reveals 5 over 5 strength of all 4 extremities. Good symmetric motor tone is noted throughout.   Gait and station: Gait is normal.   DIAGNOSTIC DATA (LABS, IMAGING, TESTING) - I reviewed patient records, labs, notes, testing and imaging myself where available.      No data to display           Lab Results  Component Value Date   WBC 3.5 10/18/2021   HGB 15.7 10/18/2021   HCT 44.6 10/18/2021   MCV 93 10/18/2021   PLT 185 10/18/2021  Component Value Date/Time   NA 141 05/30/2022 0907   K 4.4 05/30/2022 0907   CL 103 05/30/2022 0907   CO2 21 05/30/2022 0907   GLUCOSE 96 05/30/2022 0907   BUN 14 05/30/2022 0907   CREATININE 1.20 05/30/2022 0907   CALCIUM 9.6 05/30/2022 0907   PROT 6.5 05/30/2022 0907   ALBUMIN 4.7 05/30/2022 0907   AST 31 05/30/2022 0907   ALT 45 (H) 05/30/2022 0907   ALKPHOS 75 05/30/2022 0907   BILITOT 0.5 05/30/2022 0907   GFRNONAA 75 09/29/2019 0948   GFRAA 86 09/29/2019 0948   Lab Results  Component Value Date   CHOL 142 05/30/2022   HDL 78 05/30/2022   LDLCALC 57 05/30/2022   TRIG 24 05/30/2022   CHOLHDL 1.8 05/30/2022   Lab Results  Component Value Date   HGBA1C 5.1 09/07/2018   No results found for: "VITAMINB12" No results found for: "TSH"     ASSESSMENT AND PLAN 52 y.o. year old male  has a past medical history of Acne, Allergic rhinitis, GERD (gastroesophageal reflux disease), Insomnia, Olecranon bursitis, Pure hypercholesterolemia, and Vitamin D deficiency. here with   No diagnosis found.   Curtis [name] is doing well on CPAP therapy.  Compliance report reveals excellent compliance.  He was encouraged  to continue using CPAP nightly and for greater than 4 hours each night. May consider adding extended release melatonin if needed. Continue temazepam per PCP. We will repeat HST and order new CPAP pending results. Healthy lifestyle habits encouraged.  He will follow-up with [place] in 31-90 days following CPAP set up.  He verbalizes understanding and agreement with this plan.   No orders of the defined types were placed in this encounter.     No orders of the defined types were placed in this encounter.      Shawnie Dapper, FNP-C 12/04/2022, 9:00 AM [facility] [address] [place], [place] [address] [phone]

## 2022-12-05 ENCOUNTER — Ambulatory Visit: Payer: 59 | Admitting: Family Medicine

## 2022-12-05 ENCOUNTER — Encounter: Payer: 59 | Admitting: Internal Medicine

## 2022-12-05 ENCOUNTER — Encounter: Payer: Self-pay | Admitting: Family Medicine

## 2022-12-05 VITALS — BP 122/71 | HR 86 | Ht 73.0 in | Wt 213.0 lb

## 2022-12-05 DIAGNOSIS — R7989 Other specified abnormal findings of blood chemistry: Secondary | ICD-10-CM | POA: Diagnosis not present

## 2022-12-05 DIAGNOSIS — Z79899 Other long term (current) drug therapy: Secondary | ICD-10-CM | POA: Diagnosis not present

## 2022-12-05 DIAGNOSIS — G4730 Sleep apnea, unspecified: Secondary | ICD-10-CM

## 2022-12-06 ENCOUNTER — Encounter: Payer: Self-pay | Admitting: Family Medicine

## 2022-12-06 LAB — LIPID PANEL
Chol/HDL Ratio: 2.1 ratio (ref 0.0–5.0)
Cholesterol, Total: 161 mg/dL (ref 100–199)
HDL: 77 mg/dL (ref >39)
LDL Chol Calc (NIH): 75 mg/dL (ref 0–99)
Triglycerides: 38 mg/dL (ref 0–149)
VLDL Cholesterol Cal: 9 mg/dL (ref 5–40)

## 2022-12-06 LAB — GAMMA GT: GGT: 21 [IU]/L (ref 0–65)

## 2022-12-19 ENCOUNTER — Encounter: Payer: 59 | Admitting: Internal Medicine

## 2023-01-08 ENCOUNTER — Other Ambulatory Visit: Payer: Self-pay | Admitting: Internal Medicine

## 2023-01-27 NOTE — Progress Notes (Unsigned)
Madelaine Bhat, CMA,acting as a scribe for Gwynneth Aliment, MD.,have documented all relevant documentation on the behalf of Gwynneth Aliment, MD,as directed by  Gwynneth Aliment, MD while in the presence of Gwynneth Aliment, MD.  Subjective:   Patient ID: Curtis [name] , male    DOB: [birth year] , 52 y.o.   MRN: [id number]  No chief complaint on file.   HPI  Patient presents today for HM, Patient reports compliance with medication. Patient denies any chest pain, SOB, or headaches. Patient has no concerns today.     Past Medical History:  Diagnosis Date  . Acne   . Allergic rhinitis   . GERD (gastroesophageal reflux disease)   . Insomnia   . Olecranon bursitis   . Pure hypercholesterolemia   . Vitamin D deficiency      Family History  Problem Relation Age of Onset  . Healthy Mother   . Hypertension Father   . High Cholesterol Father   . CAD Father   . Healthy Brother      Current Outpatient Medications:  .  Cholecalciferol (VITAMIN D) 2000 units CAPS, Take 1 capsule by mouth daily., Disp: , Rfl:  .  cyclobenzaprine (FLEXERIL) 10 MG tablet, Take 1 tablet (10 mg total) by mouth 3 (three) times daily as needed for muscle spasms., Disp: 30 tablet, Rfl: 0 .  escitalopram (LEXAPRO) 10 MG tablet, TAKE 1 TABLET DAILY, Disp: 90 tablet, Rfl: 2 .  levocetirizine (XYZAL) 5 MG tablet, Take 5 mg by mouth as needed for allergies., Disp: , Rfl:  .  meloxicam (MOBIC) 15 MG tablet, TAKE 1 TABLET(15 MG) BY MOUTH DAILY AS NEEDED FOR PAIN, Disp: 60 tablet, Rfl: 0 .  Omega-3 Fatty Acids (FISH OIL) 500 MG CAPS, Take 1 capsule by mouth daily., Disp: , Rfl:  .  rosuvastatin (CRESTOR) 5 MG tablet, Take 1 tablet (5 mg total) by mouth daily. (Patient taking differently: Take 5 mg by mouth 3 (three) times a week.), Disp: 90 tablet, Rfl: 3 .  temazepam (RESTORIL) 15 MG capsule, TAKE 1 CAPSULE BY MOUTH AT  BEDTIME AS NEEDED FOR SLEEP, Disp: 60 capsule, Rfl: 1   No Known Allergies   Men's preventive visit.  Patient Health Questionnaire (PHQ-2) is  Flowsheet Row Office Visit from 05/30/2022 in [facility]  PHQ-2 Total Score 0     . Patient is on a *** diet. Marital status: Married. Relevant history for alcohol use is:  Social History   Substance and Sexual Activity  Alcohol Use Yes  . Alcohol/week: 6.0 standard drinks of alcohol  . Types: 6 Glasses of wine per week  . Relevant history for tobacco use is:  Social History   Tobacco Use  Smoking Status Never  Smokeless Tobacco Never  .   Review of Systems  Constitutional: Negative.   HENT: Negative.    Eyes: Negative.   Respiratory: Negative.    Cardiovascular: Negative.   Gastrointestinal: Negative.   Endocrine: Negative.   Genitourinary: Negative.   Musculoskeletal: Negative.   Skin: Negative.   Allergic/Immunologic: Negative.   Hematological: Negative.   Psychiatric/Behavioral: Negative.      There were no vitals filed for this visit. There is no height or weight on file to calculate BMI.  Wt Readings from Last 3 Encounters:  12/05/22 213 lb (96.6 kg)  08/28/22 206 lb (93.4 kg)  08/05/22 206 lb (93.4 kg)    Objective:  Physical Exam  Assessment And Plan:    Encounter for annual health examination  Pure hypercholesterolemia  Primary insomnia     No follow-ups on file. Patient was given opportunity to ask questions. Patient verbalized understanding of the plan and was able to repeat key elements of the plan. All questions were answered to their satisfaction.   Gwynneth Aliment, MD  I, Gwynneth Aliment, MD, have reviewed all documentation for this visit. The documentation on 01/27/23 for the exam, diagnosis, procedures, and orders are all accurate and complete.

## 2023-01-28 ENCOUNTER — Encounter: Payer: Self-pay | Admitting: Internal Medicine

## 2023-01-28 ENCOUNTER — Ambulatory Visit (INDEPENDENT_AMBULATORY_CARE_PROVIDER_SITE_OTHER): Payer: 59 | Admitting: Internal Medicine

## 2023-01-28 VITALS — BP 120/60 | HR 58 | Temp 98.1°F | Ht 73.0 in | Wt 202.8 lb

## 2023-01-28 DIAGNOSIS — E7800 Pure hypercholesterolemia, unspecified: Secondary | ICD-10-CM

## 2023-01-28 DIAGNOSIS — Z0000 Encounter for general adult medical examination without abnormal findings: Secondary | ICD-10-CM

## 2023-01-28 DIAGNOSIS — Z23 Encounter for immunization: Secondary | ICD-10-CM

## 2023-01-28 DIAGNOSIS — F5101 Primary insomnia: Secondary | ICD-10-CM

## 2023-01-28 NOTE — Patient Instructions (Signed)
Health Maintenance, Male Adopting a healthy lifestyle and getting preventive care are important in promoting health and wellness. Ask your health care provider about: The right schedule for you to have regular tests and exams. Things you can do on your own to prevent diseases and keep yourself healthy. What should I know about diet, weight, and exercise? Eat a healthy diet  Eat a diet that includes plenty of vegetables, fruits, low-fat dairy products, and lean protein. Do not eat a lot of foods that are high in solid fats, added sugars, or sodium. Maintain a healthy weight Body mass index (BMI) is a measurement that can be used to identify possible weight problems. It estimates body fat based on height and weight. Your health care provider can help determine your BMI and help you achieve or maintain a healthy weight. Get regular exercise Get regular exercise. This is one of the most important things you can do for your health. Most adults should: Exercise for at least 150 minutes each week. The exercise should increase your heart rate and make you sweat (moderate-intensity exercise). Do strengthening exercises at least twice a week. This is in addition to the moderate-intensity exercise. Spend less time sitting. Even light physical activity can be beneficial. Watch cholesterol and blood lipids Have your blood tested for lipids and cholesterol at 50 years of age, then have this test every 5 years. You may need to have your cholesterol levels checked more often if: Your lipid or cholesterol levels are high. You are older than 50 years of age. You are at high risk for heart disease. What should I know about cancer screening? Many types of cancers can be detected early and may often be prevented. Depending on your health history and family history, you may need to have cancer screening at various ages. This may include screening for: Colorectal cancer. Prostate cancer. Skin cancer. Lung  cancer. What should I know about heart disease, diabetes, and high blood pressure? Blood pressure and heart disease High blood pressure causes heart disease and increases the risk of stroke. This is more likely to develop in people who have high blood pressure readings or are overweight. Talk with your health care provider about your target blood pressure readings. Have your blood pressure checked: Every 3-5 years if you are 18-39 years of age. Every year if you are 40 years old or older. If you are between the ages of 65 and 75 and are a current or former smoker, ask your health care provider if you should have a one-time screening for abdominal aortic aneurysm (AAA). Diabetes Have regular diabetes screenings. This checks your fasting blood sugar level. Have the screening done: Once every three years after age 45 if you are at a normal weight and have a low risk for diabetes. More often and at a younger age if you are overweight or have a high risk for diabetes. What should I know about preventing infection? Hepatitis B If you have a higher risk for hepatitis B, you should be screened for this virus. Talk with your health care provider to find out if you are at risk for hepatitis B infection. Hepatitis C Blood testing is recommended for: Everyone born from 1945 through 1965. Anyone with known risk factors for hepatitis C. Sexually transmitted infections (STIs) You should be screened each year for STIs, including gonorrhea and chlamydia, if: You are sexually active and are younger than 50 years of age. You are older than 50 years of age and your   health care provider tells you that you are at risk for this type of infection. Your sexual activity has changed since you were last screened, and you are at increased risk for chlamydia or gonorrhea. Ask your health care provider if you are at risk. Ask your health care provider about whether you are at high risk for HIV. Your health care provider  may recommend a prescription medicine to help prevent HIV infection. If you choose to take medicine to prevent HIV, you should first get tested for HIV. You should then be tested every 3 months for as long as you are taking the medicine. Follow these instructions at home: Alcohol use Do not drink alcohol if your health care provider tells you not to drink. If you drink alcohol: Limit how much you have to 0-2 drinks a day. Know how much alcohol is in your drink. In the [place] one drink equals one 12 oz bottle of beer (355 mL), one 5 oz glass of wine (148 mL), or one 1 oz glass of hard liquor (44 mL). Lifestyle Do not use any products that contain nicotine or tobacco. These products include cigarettes, chewing tobacco, and vaping devices, such as e-cigarettes. If you need help quitting, ask your health care provider. Do not use street drugs. Do not share needles. Ask your health care provider for help if you need support or information about quitting drugs. General instructions Schedule regular health, dental, and eye exams. Stay current with your vaccines. Tell your health care provider if: You often feel depressed. You have ever been abused or do not feel safe at home. Summary Adopting a healthy lifestyle and getting preventive care are important in promoting health and wellness. Follow your health care provider's instructions about healthy diet, exercising, and getting tested or screened for diseases. Follow your health care provider's instructions on monitoring your cholesterol and blood pressure. This information is not intended to replace advice given to you by your health care provider. Make sure you discuss any questions you have with your health care provider. Document Revised: 06/12/2020 Document Reviewed: 06/12/2020 Elsevier Patient Education  2024 [employer]  

## 2023-01-30 NOTE — Assessment & Plan Note (Signed)
A full exam was performed.  DRE deferred, per his request.  He is advised to get 30-45 minutes of regular exercise, no less than four to five days per week. Both weight-bearing and aerobic exercises are recommended.  He is advised to follow a healthy diet with at least six fruits/veggies per day, decrease intake of red meat and other saturated fats and to increase fish intake to twice weekly.  Meats/fish should not be fried -- baked, boiled or broiled is preferable. It is also important to cut back on your sugar intake.  Be sure to read labels - try to avoid anything with added sugar, high fructose corn syrup or other sweeteners.  If you must use a sweetener, you can try stevia or monkfruit.  It is also important to avoid artificially sweetened foods/beverages and diet drinks. Lastly, wear SPF 50 sunscreen on exposed skin and when in direct sunlight for an extended period of time.  Be sure to avoid fast food restaurants and aim for at least 60 ounces of water daily.

## 2023-02-07 ENCOUNTER — Ambulatory Visit (INDEPENDENT_AMBULATORY_CARE_PROVIDER_SITE_OTHER): Payer: 59

## 2023-02-07 VITALS — BP 124/60 | HR 70 | Temp 98.5°F | Ht 73.0 in | Wt 202.0 lb

## 2023-02-07 DIAGNOSIS — Z111 Encounter for screening for respiratory tuberculosis: Secondary | ICD-10-CM

## 2023-02-07 DIAGNOSIS — Z0000 Encounter for general adult medical examination without abnormal findings: Secondary | ICD-10-CM

## 2023-02-07 DIAGNOSIS — Z23 Encounter for immunization: Secondary | ICD-10-CM

## 2023-02-07 NOTE — Progress Notes (Signed)
 Patient presents today for a flu shot, patient received flu shot in his L arm.

## 2023-02-11 LAB — CBC
Hematocrit: 48.1 % (ref 37.5–51.0)
Hemoglobin: 16.3 g/dL (ref 13.0–17.7)
MCH: 32.3 pg (ref 26.6–33.0)
MCHC: 33.9 g/dL (ref 31.5–35.7)
MCV: 95 fL (ref 79–97)
Platelets: 197 10*3/uL (ref 150–450)
RBC: 5.05 x10E6/uL (ref 4.14–5.80)
RDW: 12.8 % (ref 11.6–15.4)
WBC: 4.1 10*3/uL (ref 3.4–10.8)

## 2023-02-11 LAB — LIPID PANEL
Chol/HDL Ratio: 2.4 {ratio} (ref 0.0–5.0)
Cholesterol, Total: 164 mg/dL (ref 100–199)
HDL: 69 mg/dL (ref >39)
LDL Chol Calc (NIH): 85 mg/dL (ref 0–99)
Triglycerides: 45 mg/dL (ref 0–149)
VLDL Cholesterol Cal: 10 mg/dL (ref 5–40)

## 2023-02-11 LAB — CMP14+EGFR
ALT: 56 [IU]/L — ABNORMAL HIGH (ref 0–44)
AST: 40 [IU]/L (ref 0–40)
Albumin: 5 g/dL (ref 4.1–5.1)
Alkaline Phosphatase: 81 [IU]/L (ref 44–121)
BUN/Creatinine Ratio: 11 (ref 9–20)
BUN: 13 mg/dL (ref 6–24)
Bilirubin Total: 0.5 mg/dL (ref 0.0–1.2)
CO2: 26 mmol/L (ref 20–29)
Calcium: 9.6 mg/dL (ref 8.7–10.2)
Chloride: 103 mmol/L (ref 96–106)
Creatinine, Ser: 1.14 mg/dL (ref 0.76–1.27)
Globulin, Total: 1.9 g/dL (ref 1.5–4.5)
Glucose: 104 mg/dL — ABNORMAL HIGH (ref 70–99)
Potassium: 4.5 mmol/L (ref 3.5–5.2)
Sodium: 145 mmol/L — ABNORMAL HIGH (ref 134–144)
Total Protein: 6.9 g/dL (ref 6.0–8.5)
eGFR: 78 mL/min/{1.73_m2} (ref >59)

## 2023-02-11 LAB — PSA: Prostate Specific Ag, Serum: 0.5 ng/mL (ref 0.0–4.0)

## 2023-02-11 LAB — QUANTIFERON-TB GOLD PLUS
QuantiFERON Mitogen Value: >10 [IU]/mL
QuantiFERON Nil Value: 0.07 [IU]/mL
QuantiFERON TB1 Ag Value: 0.09 [IU]/mL
QuantiFERON TB2 Ag Value: 0.07 [IU]/mL
QuantiFERON-TB Gold Plus: NEGATIVE

## 2023-02-11 LAB — TSH: TSH: 2.39 u[IU]/mL (ref 0.450–4.500)

## 2023-02-28 ENCOUNTER — Ambulatory Visit (INDEPENDENT_AMBULATORY_CARE_PROVIDER_SITE_OTHER): Payer: 59

## 2023-02-28 VITALS — BP 122/64 | HR 85 | Temp 98.6°F | Ht 73.0 in | Wt 202.0 lb

## 2023-02-28 DIAGNOSIS — Z23 Encounter for immunization: Secondary | ICD-10-CM | POA: Diagnosis not present

## 2023-02-28 NOTE — Progress Notes (Signed)
Patient presents today for shingles vaccine. They deny symptoms of recent infection, fever, and chills. They received injection in L arm.

## 2023-05-10 ENCOUNTER — Other Ambulatory Visit: Payer: Self-pay | Admitting: Internal Medicine

## 2023-05-10 DIAGNOSIS — F419 Anxiety disorder, unspecified: Secondary | ICD-10-CM

## 2023-05-14 ENCOUNTER — Other Ambulatory Visit: Payer: Self-pay | Admitting: Internal Medicine

## 2023-08-03 ENCOUNTER — Other Ambulatory Visit: Payer: Self-pay | Admitting: Internal Medicine

## 2023-10-08 ENCOUNTER — Other Ambulatory Visit: Payer: Self-pay | Admitting: Internal Medicine

## 2023-12-10 NOTE — Progress Notes (Signed)
 PATIENT: Curtis [name] DOB: [birth year]  REASON FOR VISIT: follow up HISTORY FROM: patient  Chief Complaint  Patient presents with   RM1/CPAP    Pt is here Alone. Pt states he is waking up at 2:30-3:00 am for the past 2 weeks and would like to discuss it.      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 He continues to do well on therapy. He is using CPAP nightly for about 8 hours, on average. He denies concerns with machine or supplies. He uses travel machine as well. He does report more difficulty staying asleep. 10 of the last 14 days he has woken around 2-3am and has a hard time getting back to sleep. He has taken temazepam  for years. No clear triggers. He has drank more caffeine recently due to feeling tired in the mornings. He exercises daily.     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 He repeated HST 09/2022 showing severe sleep apnea, predominantly obstructive in origin. New autoPAP ordered. He is doing well with new machine. He is using therapy nightly for about 8 hours. Travel machine works well. He has airsense mini. He denies difficulty with machine or supplies. He sleeps fairly well. He takes temazepam . Occasionally takes melatonin.   He requests we check labs for upcoming dermatology visit on Accutane.     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 He continues to do well. He is using therapy nightly for about 8-9 hours. He does continue to have intermittent sleep maintenance insomnia. He continues temazepam  per PCP. No concerns with CPAP machine or supplies. He is eligible for a new machine.     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 He continues CPAP therapy and doing very well. He denies concerns with machine or supplies. He wakes feeling refreshed. No difficulty sleeping.       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 He was previously taking trazodone  50mg  but switched to temazepam  15mg  by PCP and escitalopram  10mg  daily. He feels that he is doing very well. He is sleeping better. He is using CPAP nightly. He did have an issue with his tubing that prevented usage a couple of nights last month but he usually uses CPAP every night. No concerns with [employer].     08/23/2019 ALL:  Curtis [name] is a 53 y.o. male here today for follow up for central sleep apnea treated with CPAP and insomnia on trazodone  50mg  daily. He reports that he is doing well. He is using CPAP nightly. Trazodone  has helped him stay asleep longer. He seems to wake most days around 4-5am. He also takes melatonin every night and occasionally will take temazepam  prescribed by PCP. He is feeling well today and without concerns.   Compliance report dated 07/20/2019 through 08/18/2019 reveals that he has used CPAP 29 of the past 30 days for compliance of 97%.  He used CPAP greater than 4 hours 27 of the past 30 days for compliance of 90%.  Average usage was 7 hours and 45 minutes.  Residual AHI was 2.0 on 5 to 9 cm of water and an EPR of 2.  There was no leak noted.  HISTORY: (copied from Dr Dohmeier's note on 02/22/2019)  RV 02-22-2019, I have the pleasure  of meeting with Curtis [name] today, an established complex sleep apnea patient ,who also has insomnia. While his primary central sleep apnea has been controlled he still has difficulties to  sleep through the night. His compliance on his air sense 10 machine is excellent he uses a very small pressure window between 5 and 9 cmH2O this 2 cm expiratory pressure relief, average use at time 6 hours 25 minutes and he has a 93% compliance by days.  His residual AHI is 2.3 and the residual apneas are mostly central at 1.5/h however this is a good resolution of apnea.  She endorsed the fatigue severity scale 24 points  out of 63.  He endorsed the Epworth sleepiness score at only 4 out of 24 points. He goes to sleep around 10 10:30 PM but he has problems with waking up early at around 4 AM but he does not need to get up and it is not desired to get up.  However there are also days and weeks where he can catch 7 hours of sleep alternating with days and nights of 5 or 5-1/2 hours of sleep.  The early morning awakening may be related to low serotonin levels.  I will be happy to discuss cyclic sleep disorders here today.   02-18-2018,  RV after 2 sleep studies.  Curtis [name] is a 53 y.o. caucasian, right handed  male patient and was seen in a referral from Dr. Jarold for a sleep medicine consultation.  Curtis [name] underwent a baseline polysomnography with an expanded EEG montage of 29 September 2017 which ended up in a surprising result he had by far dominating central apneas of obstructive apneas.  58 central apneas were seen 10 obstructive 7 2 mixed as well as 44 hypopneas.  The total AHI was 17.5 in the mild-to-moderate range of the RDI was not higher.  During REM sleep there was a less apnea which is a typical distribution for a central apnea patient.  In supine sleep there were 28.5 versus nonsupine 6.0/h.  He also had sinus bradycardia which can be also related to his athletic activities.  Primary snoring was noted and be due to the central apnea nature we asked him to return for an attended CPAP titration which took place on 7 October.  The AHI was reduced to 5.2 overall at a final pressure of 8 cmH2O there was a reduction of 0.0 AHI and 100% sleep efficiency.  The technologist gave the patient a Simplus mask which is a fullface model in large size.  He is now using an auto titration CPAP with a pressure range between 5 and 9 cmH2O and one 1 cm EPR his compliance was 97% by days and 25 of those days were over 4 hours of daily use with an average user time of 6 hours and 8 minutes.  The residual AHI is 5.0 and the residual  apneas are still central in nature.  However we manage not to provoke more central apneas.   He feels as if he is still not sleeping super long, but his sleep has improved, his sleep quality is more restorative. Bradycardia. He trains 4 days a week, cardio and weight lifting.    Epworth changed on CPAP to 2 points and FSS to 13 points.      HPI: Chief complaint according to patient : Increasingly fatigued, and sleepy. I have wake up early and can't go back to sleep . He works for [employer] as a psychologist, counselling. Curtis [name]  reports that he has noted certain cycles or phases of sleep changes.  There will be times where he wakes up at the exact time every morning for example 3; 40 minutes AM and after a couple of weeks or so this again will change. It has never been difficult for him to go to sleep in the first place but if he goes to early he also wakes up early.  He craves to be able to sleep 7 or 8 hours but this has rarely happened. He hasn't slept in daytime , has never been a napper.   Sleep habits are as follows: The patient aims for a bedtime between 10 and 11 PM, if he would go to sleep earlier he would wake up earlier and he always avoids going to sleep later than midnight. He feels pretty good at this routine. Bedroom is cool , quiet and dark. He shares the bedroom with his wife, but he feels usually better when alone in bed. He sleeps poorly in hotels.  Kids are 8 and 11, no pets in the bedroom. Bathroom break will wake him up some days, mostly around 3 or 4 AM.  He has vivid dreams- but feels these don't wake him, no pain or discomfort that interrupts his sleep. Used to have some low back pain that has resolved.   No enactment, no sleep . Wife reports snoring, mildly and more after alcohol ( weekends ) and only on his back.  He wakes at 6. 30 AM spontaneously. He is always able to wake without alarm. He feels refreshed some days, only after at least 6 hours of sleep. Often he is fatigued.     Sleep medical history and family sleep history: father was a snorer, lost weight- CPAP user. Mother is a agricultural consultant. One brother who is  younger by 3.5 years sleeps well.     Social history: he practices good sleep hygiene. He doesn't have electronics in the bedroom- married, 2 children in school age. Sales rep , travels by car.  Non smoker, exerciser, ETOH- wine or beer on weekends, 2-3 beers per WE evening, caffeine - no soda, 2 cups of coffee in AM, sweet tea at lunch.      REVIEW OF SYSTEMS: Out of a complete 14 system review of symptoms, the patient complains only of the following symptoms, insomnia and all other reviewed systems are negative.  ESS: 1/24   ALLERGIES: No Known Allergies  HOME MEDICATIONS: Outpatient Medications Prior to Visit  Medication Sig Dispense Refill   Cholecalciferol (VITAMIN D) 2000 units CAPS Take 1 capsule by mouth daily.     escitalopram  (LEXAPRO ) 10 MG tablet TAKE 1 TABLET BY MOUTH DAILY 90 tablet 3   Omega-3 Fatty Acids (FISH OIL) 500 MG CAPS Take 1 capsule by mouth daily.     rosuvastatin  (CRESTOR ) 5 MG tablet TAKE 1 TABLET BY MOUTH DAILY 90 tablet 3   temazepam  (RESTORIL ) 15 MG capsule TAKE 1 CAPSULE BY MOUTH AT  BEDTIME AS NEEDED FOR SLEEP 60 capsule 0   cyclobenzaprine  (FLEXERIL ) 10 MG tablet Take 1 tablet (10 mg total) by mouth 3 (three) times daily as needed for muscle spasms. (Patient not taking: Reported on 12/15/2023) 30 tablet 0   ISOtretinoin (ACCUTANE) 40 MG capsule Take 40 mg by mouth 2 (two) times daily. (Patient not taking: Reported on 12/15/2023)     levocetirizine (XYZAL ) 5 MG tablet Take 5 mg by mouth as needed for allergies. (Patient not taking: Reported on 12/15/2023)  meloxicam  (MOBIC ) 15 MG tablet TAKE 1 TABLET(15 MG) BY MOUTH DAILY AS NEEDED FOR PAIN (Patient not taking: Reported on 12/15/2023) 60 tablet 0   No facility-administered medications prior to visit.    PAST MEDICAL HISTORY: Past Medical History:  Diagnosis  Date   Acne    Allergic rhinitis    GERD (gastroesophageal reflux disease)    Insomnia    Olecranon bursitis    Pure hypercholesterolemia    Vitamin D deficiency     PAST SURGICAL HISTORY: Past Surgical History:  Procedure Laterality Date   INGUINAL HERNIA REPAIR Right    WISDOM TOOTH EXTRACTION      FAMILY HISTORY: Family History  Problem Relation Age of Onset   Healthy Mother    Hypertension Father    High Cholesterol Father    CAD Father    Healthy Brother     SOCIAL HISTORY: Social History   Socioeconomic History   Marital status: Married    Spouse name: Not on file   Number of children: Not on file   Years of education: Not on file   Highest education level: Professional school degree (e.g., MD, DDS, DVM, JD)  Occupational History   Not on file  Tobacco Use   Smoking status: Never   Smokeless tobacco: Never  Vaping Use   Vaping status: Never Used  Substance and Sexual Activity   Alcohol use: Yes    Alcohol/week: 6.0 standard drinks of alcohol    Types: 6 Glasses of wine per week   Drug use: Never   Sexual activity: Not on file  Other Topics Concern   Not on file  Social History Narrative   Not on file   Social Drivers of Health   Financial Resource Strain: Low Risk  (05/27/2022)   Overall Financial Resource Strain (CARDIA)    Difficulty of Paying Living Expenses: Not hard at all  Food Insecurity: No Food Insecurity (05/27/2022)   Hunger Vital Sign    Worried About Running Out of Food in the Last Year: Never true    Ran Out of Food in the Last Year: Never true  Transportation Needs: No Transportation Needs (05/27/2022)   PRAPARE - Administrator, Civil Service (Medical): No    Lack of Transportation (Non-Medical): No  Physical Activity: Sufficiently Active (05/27/2022)   Exercise Vital Sign    Days of Exercise per Week: 5 days    Minutes of Exercise per Session: 70 min  Stress: Not on file (12/10/2022)  Social Connections: Socially  Integrated (05/27/2022)   Social Connection and Isolation Panel    Frequency of Communication with Friends and Family: More than three times a week    Frequency of Social Gatherings with Friends and Family: More than three times a week    Attends Religious Services: More than 4 times per year    Active Member of [employer] or Organizations: Yes    Attends Banker Meetings: More than 4 times per year    Marital Status: Married  Catering Manager Violence: Low Risk (05/20/2019)   [facility] from [facility]   Intimate Partner Violence    Insults You: Not on file    Threatens You: Not on file    Screams at You: Not on file    Physically Hurt: Not on file    Intimate Partner Violence Score: Not on file      PHYSICAL EXAM  Vitals:   12/15/23 0948  BP: (!) 149/99  Pulse: (!) 53  SpO2: 99%  Weight: 211 lb (95.7 kg)  Height: 6' 1 (1.854 m)       Body mass index is 27.84 kg/m.  Generalized: Well developed, in no acute distress  Cardiology: normal rate and rhythm, no murmur noted Respiratory: clear to auscultation bilaterally  Neurological examination  Mentation: Alert oriented to time, place, history taking. Follows all commands speech and language fluent Cranial nerve II-XII: Pupils were equal round reactive to light. Extraocular movements were full, visual field were full  Motor: The motor testing reveals 5 over 5 strength of all 4 extremities. Good symmetric motor tone is noted throughout.   Gait and station: Gait is normal.   DIAGNOSTIC DATA (LABS, IMAGING, TESTING) - I reviewed patient records, labs, notes, testing and imaging myself where available.      No data to display           Lab Results  Component Value Date   WBC 4.1 02/07/2023   HGB 16.3 02/07/2023   HCT 48.1 02/07/2023   MCV 95 02/07/2023   PLT 197 02/07/2023      Component Value Date/Time   NA 145 (H) 02/07/2023 0918   K 4.5 02/07/2023 0918   CL 103 02/07/2023 0918   CO2 26  02/07/2023 0918   GLUCOSE 104 (H) 02/07/2023 0918   BUN 13 02/07/2023 0918   CREATININE 1.14 02/07/2023 0918   CALCIUM  9.6 02/07/2023 0918   PROT 6.9 02/07/2023 0918   ALBUMIN 5.0 02/07/2023 0918   AST 40 02/07/2023 0918   ALT 56 (H) 02/07/2023 0918   ALKPHOS 81 02/07/2023 0918   BILITOT 0.5 02/07/2023 0918   GFRNONAA 75 09/29/2019 0948   GFRAA 86 09/29/2019 0948   Lab Results  Component Value Date   CHOL 164 02/07/2023   HDL 69 02/07/2023   LDLCALC 85 02/07/2023   TRIG 45 02/07/2023   CHOLHDL 2.4 02/07/2023   Lab Results  Component Value Date   HGBA1C 5.1 09/07/2018   No results found for: VITAMINB12 Lab Results  Component Value Date   TSH 2.390 02/07/2023       ASSESSMENT AND PLAN 53 y.o. year old male  has a past medical history of Acne, Allergic rhinitis, GERD (gastroesophageal reflux disease), Insomnia, Olecranon bursitis, Pure hypercholesterolemia, and Vitamin D deficiency. here with     ICD-10-CM   1. Sleep apnea with use of continuous positive airway pressure (CPAP)  G47.30 For home use only DME continuous positive airway pressure (CPAP)      Brey is doing well on CPAP therapy.  Compliance report reveals excellent compliance.  He was encouraged to continue using CPAP nightly and for greater than 4 hours each night. May consider adding extended release melatonin if needed. Continue temazepam  per PCP. Quality sleep habits discussed. Healthy lifestyle habits encouraged.  He will follow-up with us  in 1 year, sooner if needed. He verbalizes understanding and agreement with this plan.   Orders Placed This Encounter  Procedures   For home use only DME continuous positive airway pressure (CPAP)    Supplies    Length of Need:   Lifetime    Patient has OSA or probable OSA:   Yes    Is the patient currently using CPAP in the home:   Yes    Settings:   Other see comments    CPAP supplies needed:   Mask, headgear, cushions, filters, heated tubing and water chamber       No orders of the defined types were placed  in this encounter.      Curtis Forbes, FNP-C 12/15/2023, 10:09 AM [facility] [address], [address] [place], [place] [address] [phone]

## 2023-12-10 NOTE — Patient Instructions (Addendum)
 Please continue using your CPAP regularly. While your insurance requires that you use CPAP at least 4 hours each night on 70% of the nights, I recommend, that you not skip any nights and use it throughout the night if you can. Getting used to CPAP and staying with the treatment long term does take time and patience and discipline. Untreated obstructive sleep apnea when it is moderate to severe can have an adverse impact on cardiovascular health and raise her risk for heart disease, arrhythmias, hypertension, congestive heart failure, stroke and diabetes. Untreated obstructive sleep apnea causes sleep disruption, nonrestorative sleep, and sleep deprivation. This can have an impact on your day to day functioning and cause daytime sleepiness and impairment of cognitive function, memory loss, mood disturbance, and problems focussing. Using CPAP regularly can improve these symptoms.  We will update supply orders, today. Consider adding extended release melatonin at bedtime. Monitor for any specific triggers to sleep patter changes. Try to limit/eliminate caffeine use.   Follow up in 1 year

## 2023-12-11 NOTE — Progress Notes (Signed)
 SABRA

## 2023-12-15 ENCOUNTER — Encounter: Payer: Self-pay | Admitting: Family Medicine

## 2023-12-15 ENCOUNTER — Ambulatory Visit: Payer: 59 | Admitting: Family Medicine

## 2023-12-15 VITALS — BP 149/99 | HR 53 | Ht 73.0 in | Wt 211.0 lb

## 2023-12-15 DIAGNOSIS — G4730 Sleep apnea, unspecified: Secondary | ICD-10-CM

## 2023-12-20 ENCOUNTER — Other Ambulatory Visit: Payer: Self-pay | Admitting: Internal Medicine

## 2024-02-12 ENCOUNTER — Ambulatory Visit: Payer: 59 | Admitting: Internal Medicine

## 2024-02-12 VITALS — BP 110/70 | HR 65 | Temp 98.3°F | Ht 73.0 in | Wt 209.6 lb

## 2024-02-12 DIAGNOSIS — F419 Anxiety disorder, unspecified: Secondary | ICD-10-CM | POA: Diagnosis not present

## 2024-02-12 DIAGNOSIS — Z23 Encounter for immunization: Secondary | ICD-10-CM

## 2024-02-12 DIAGNOSIS — Z0000 Encounter for general adult medical examination without abnormal findings: Secondary | ICD-10-CM | POA: Diagnosis not present

## 2024-02-12 DIAGNOSIS — Z111 Encounter for screening for respiratory tuberculosis: Secondary | ICD-10-CM | POA: Diagnosis not present

## 2024-02-12 DIAGNOSIS — E7800 Pure hypercholesterolemia, unspecified: Secondary | ICD-10-CM

## 2024-02-12 DIAGNOSIS — Z6827 Body mass index (BMI) 27.0-27.9, adult: Secondary | ICD-10-CM

## 2024-02-12 DIAGNOSIS — G4730 Sleep apnea, unspecified: Secondary | ICD-10-CM | POA: Diagnosis not present

## 2024-02-12 DIAGNOSIS — E663 Overweight: Secondary | ICD-10-CM

## 2024-02-12 LAB — POC HEMOCCULT BLD/STL (OFFICE/1-CARD/DIAGNOSTIC): Fecal Occult Blood, POC: NEGATIVE

## 2024-02-12 MED ORDER — TRAZODONE HCL 50 MG PO TABS: 50.0000 mg | ORAL_TABLET | Freq: Every day | ORAL | 0 refills | Status: AC

## 2024-02-12 MED ORDER — ZEPBOUND 5 MG/0.5ML ~~LOC~~ SOAJ: 5.0000 mg | SUBCUTANEOUS | 0 refills | Status: AC

## 2024-02-12 NOTE — Progress Notes (Signed)
 I,[name], CMA,acting as a neurosurgeon for Curtis LOISE Slocumb, MD.,have documented all relevant documentation on the behalf of Curtis LOISE Slocumb, MD,as directed by  Curtis LOISE Slocumb, MD while in the presence of Curtis LOISE Slocumb, MD.  Subjective:   Patient ID: Curtis [name] , male    DOB: May 17, 1970 , 54 y.o.   MRN: [id number]  Chief Complaint  Patient presents with   Annual Exam    Patient presents today for HM, Patient reports compliance with medication. Patient denies having any chest pain, SOB, or headaches. Patient has no concerns today.   Hyperlipidemia    HPI Discussed the use of AI scribe software for clinical note transcription with the patient, who gave verbal consent to proceed.  History of Present Illness [name] is a 54 year old male with severe sleep apnea who presents with sleep disturbances and weight gain.  He experiences difficulty staying asleep, and while trazodone  has been effective in the past, he is considering adjustments due to periods of disrupted sleep. He is not currently taking magnesium glycinate. He takes Lexapro  in the morning for anxiety.  He has gained approximately 30 pounds, now weighing 210 pounds, which he attributes to aging despite maintaining a workout routine of five days a week. He is interested in exploring treatment options for weight management.  He uses a CPAP machine for sleep apnea.  He is scheduled for an MRI of his neck due to persistent discomfort, which he initially thought was muscle tension. He has a history of neck issues but has not undergone surgery. He is under the care of an orthopedic specialist, Velinda Chancy.  He is up to date with his vaccinations, including COVID and flu shots, and is considering a pneumonia shot. He also needs a TB test, as his last one was in January of the previous year.   Hyperlipidemia This is a chronic problem. The current episode started more than 1 year ago. Pertinent negatives include no  myalgias or shortness of breath. Current antihyperlipidemic treatment includes statins. The current treatment provides moderate improvement of lipids.     Past Medical History:  Diagnosis Date   Acne    Allergic rhinitis    GERD (gastroesophageal reflux disease)    Insomnia    Olecranon bursitis    Pure hypercholesterolemia    Sleep apnea    Vitamin D deficiency      Family History  Problem Relation Age of Onset   Healthy Mother    Hypertension Father    High Cholesterol Father    CAD Father    Heart attack Father    Heart disease Father    Healthy Brother     Current Medications[1]   Allergies[2]   Men's preventive visit. Patient Health Questionnaire (PHQ-2) is  Flowsheet Row Office Visit from 02/12/2024 in [facility]  PHQ-2 Total Score 0  . Patient is on a healthy diet. Marital status: Married. Relevant history for alcohol use is:  Social History   Substance and Sexual Activity  Alcohol Use Yes   Alcohol/week: 6.0 standard drinks of alcohol   Types: 6 Glasses of wine per week  . Relevant history for tobacco use is: Tobacco Use History[3].   Review of Systems  Constitutional:  Positive for unexpected weight change.  HENT: Negative.    Eyes: Negative.   Respiratory: Negative.  Negative for shortness of breath.   Gastrointestinal: Negative.   Endocrine: Negative.   Genitourinary: Negative.  Musculoskeletal: Negative.  Negative for myalgias.  Skin: Negative.   Allergic/Immunologic: Negative.   Neurological: Negative.   Hematological: Negative.   Psychiatric/Behavioral: Negative.       Today's Vitals   02/12/24 0850  BP: 110/70  Pulse: 65  Temp: 98.3 F (36.8 C)  SpO2: 98%  Weight: 209 lb 9.6 oz (95.1 kg)  Height: 6' 1 (1.854 m)   Body mass index is 27.65 kg/m.  Wt Readings from Last 3 Encounters:  02/20/24 209 lb (94.8 kg)  02/12/24 209 lb 9.6 oz (95.1 kg)  12/15/23 211 lb (95.7 kg)    Objective:  Physical  Exam Vitals and nursing note reviewed. Exam conducted with a chaperone present.  Constitutional:      Appearance: Normal appearance.  HENT:     Head: Normocephalic and atraumatic.     Right Ear: Tympanic membrane, ear canal and external ear normal.     Left Ear: Tympanic membrane, ear canal and external ear normal.     Nose: Nose normal.     Mouth/Throat:     Mouth: Mucous membranes are moist.     Pharynx: Oropharynx is clear.  Eyes:     Extraocular Movements: Extraocular movements intact.     Conjunctiva/sclera: Conjunctivae normal.     Pupils: Pupils are equal, round, and reactive to light.  Cardiovascular:     Rate and Rhythm: Normal rate and regular rhythm.     Pulses: Normal pulses.     Heart sounds: Normal heart sounds.  Pulmonary:     Effort: Pulmonary effort is normal.     Breath sounds: Normal breath sounds.  Chest:  Breasts:    Right: Normal. No swelling, bleeding, inverted nipple, mass or nipple discharge.     Left: Normal. No swelling, bleeding, inverted nipple, mass or nipple discharge.  Abdominal:     General: Abdomen is flat. Bowel sounds are normal.     Palpations: Abdomen is soft.  Genitourinary:    Prostate: Normal.     Rectum: Normal. Guaiac result negative. No tenderness.  Musculoskeletal:        General: Normal range of motion.     Cervical back: Normal range of motion and neck supple.  Skin:    General: Skin is dry.  Neurological:     General: No focal deficit present.     Mental Status: He is alert.  Psychiatric:        Mood and Affect: Mood normal.        Behavior: Behavior normal.         Assessment And Plan:    Encounter for annual health examination Assessment & Plan: A full exam was performed.  DRE performed, stool is heme negative.  He is advised to get 30-45 minutes of regular exercise, no less than four to five days per week. Both weight-bearing and aerobic exercises are recommended.  He is advised to follow a healthy diet with at  least six fruits/veggies per day, decrease intake of red meat and other saturated fats and to increase fish intake to twice weekly.  Meats/fish should not be fried -- baked, boiled or broiled is preferable. It is also important to cut back on your sugar intake.  Be sure to read labels - try to avoid anything with added sugar, high fructose corn syrup or other sweeteners.  If you must use a sweetener, you can try stevia or monkfruit.  It is also important to avoid artificially sweetened foods/beverages and diet drinks. Lastly, wear SPF  50 sunscreen on exposed skin and when in direct sunlight for an extended period of time.  Be sure to avoid fast food restaurants and aim for at least 60 ounces of water daily.      Orders: -     CMP14+EGFR -     Lipid panel -     Hemoglobin A1c -     CBC -     PSA -     POC Hemoccult Bld/Stl (1-Cd Office Dx)  Pure hypercholesterolemia Assessment & Plan: Chronic, currently on rosuvastatin .  - Follow heart healthy lifestyle.    Sleep apnea with use of continuous positive airway pressure (CPAP) Assessment & Plan: Obstructive sleep apnea. Discussed potential benefits of GLP-1 therapy with CPAP use. - Initiated Zepbound  for sleep apnea. - Will send rx 5mg  Zepbound  to start PA process - If approved, will start with 2.5mg  sample - Encouraged to stop eating when full. - Optimize protein intake.    Anxiety Assessment & Plan: Taking Lexapro . Discussed potential interaction with trazodone . Advised monitoring for serotonin syndrome symptoms. - Consider reducing Lexapro  to half a pill in the morning. - May need to switch to Wellbutrin or hydroxyzine. - Monitor for symptoms of serotonin syndrome.   Overweight with body mass index (BMI) of 27 to 27.9 in adult Assessment & Plan: Weight gain of 30 pounds, currently at 210 pounds. Considering GLP-1 receptor agonist therapy. No family history of thyroid cancer, a contraindication for GLP-1 therapy. Discussed insurance  coverage and cash pricing for GLP-1 therapy. - Initiated Zepbound  to address sleep apnea, which will benefit weight management, pending insurance approval. - Discussed cash pricing options for GLP-1 therapy if insurance does not cover.   Screening-pulmonary TB -     QuantiFERON-TB Gold Plus  Other orders -     traZODone  HCl; Take 1 tablet (50 mg total) by mouth at bedtime.  Dispense: 90 tablet; Refill: 0 -     Zepbound ; Inject 5 mg into the skin once a week.  Dispense: 2 mL; Refill: 0   Return in 4 weeks (on 03/11/2024), or med check - okay for virtual, for 1 YEAR HM, 6 MONTH CHOL F/U.SABRA Patient was given opportunity to ask questions. Patient verbalized understanding of the plan and was able to repeat key elements of the plan. All questions were answered to their satisfaction.    I, Curtis LOISE Slocumb, MD, have reviewed all documentation for this visit. The documentation on 02/12/2024 for the exam, diagnosis, procedures, and orders are all accurate and complete.      [1]  Current Outpatient Medications:    Cholecalciferol (VITAMIN D) 2000 units CAPS, Take 1 capsule by mouth daily., Disp: , Rfl:    escitalopram  (LEXAPRO ) 10 MG tablet, TAKE 1 TABLET BY MOUTH DAILY, Disp: 90 tablet, Rfl: 3   Omega-3 Fatty Acids (FISH OIL) 500 MG CAPS, Take 1 capsule by mouth daily., Disp: , Rfl:    rosuvastatin  (CRESTOR ) 5 MG tablet, TAKE 1 TABLET BY MOUTH DAILY, Disp: 90 tablet, Rfl: 3   temazepam  (RESTORIL ) 15 MG capsule, TAKE 1 CAPSULE BY MOUTH AT  BEDTIME AS NEEDED FOR SLEEP, Disp: 60 capsule, Rfl: 1   tirzepatide  (ZEPBOUND ) 5 MG/0.5ML Pen, Inject 5 mg into the skin once a week., Disp: 2 mL, Rfl: 0   traZODone  (DESYREL ) 50 MG tablet, Take 1 tablet (50 mg total) by mouth at bedtime., Disp: 90 tablet, Rfl: 0 [2] No Known Allergies [3]  Social History Tobacco Use  Smoking Status Never  Smokeless  Tobacco Never

## 2024-02-12 NOTE — Patient Instructions (Addendum)
 Magnesium glycinate Health Maintenance, Male Adopting a healthy lifestyle and getting preventive care are important in promoting health and wellness. Ask your health care provider about: The right schedule for you to have regular tests and exams. Things you can do on your own to prevent diseases and keep yourself healthy. What should I know about diet, weight, and exercise? Eat a healthy diet  Eat a diet that includes plenty of vegetables, fruits, low-fat dairy products, and lean protein. Do not eat a lot of foods that are high in solid fats, added sugars, or sodium. Maintain a healthy weight Body mass index (BMI) is a measurement that can be used to identify possible weight problems. It estimates body fat based on height and weight. Your health care provider can help determine your BMI and help you achieve or maintain a healthy weight. Get regular exercise Get regular exercise. This is one of the most important things you can do for your health. Most adults should: Exercise for at least 150 minutes each week. The exercise should increase your heart rate and make you sweat (moderate-intensity exercise). Do strengthening exercises at least twice a week. This is in addition to the moderate-intensity exercise. Spend less time sitting. Even light physical activity can be beneficial. Watch cholesterol and blood lipids Have your blood tested for lipids and cholesterol at 52 years of age, then have this test every 5 years. You may need to have your cholesterol levels checked more often if: Your lipid or cholesterol levels are high. You are older than 52 years of age. You are at high risk for heart disease. What should I know about cancer screening? Many types of cancers can be detected early and may often be prevented. Depending on your health history and family history, you may need to have cancer screening at various ages. This may include screening for: Colorectal cancer. Prostate cancer. Skin  cancer. Lung cancer. What should I know about heart disease, diabetes, and high blood pressure? Blood pressure and heart disease High blood pressure causes heart disease and increases the risk of stroke. This is more likely to develop in people who have high blood pressure readings or are overweight. Talk with your health care provider about your target blood pressure readings. Have your blood pressure checked: Every 3-5 years if you are 63-36 years of age. Every year if you are 47 years old or older. If you are between the ages of 47 and 79 and are a current or former smoker, ask your health care provider if you should have a one-time screening for abdominal aortic aneurysm (AAA). Diabetes Have regular diabetes screenings. This checks your fasting blood sugar level. Have the screening done: Once every three years after age 55 if you are at a normal weight and have a low risk for diabetes. More often and at a younger age if you are overweight or have a high risk for diabetes. What should I know about preventing infection? Hepatitis B If you have a higher risk for hepatitis B, you should be screened for this virus. Talk with your health care provider to find out if you are at risk for hepatitis B infection. Hepatitis C Blood testing is recommended for: Everyone born from [birth year] through 1963. Anyone with known risk factors for hepatitis C. Sexually transmitted infections (STIs) You should be screened each year for STIs, including gonorrhea and chlamydia, if: You are sexually active and are younger than 52 years of age. You are older than 52 years of age  and your health care provider tells you that you are at risk for this type of infection. Your sexual activity has changed since you were last screened, and you are at increased risk for chlamydia or gonorrhea. Ask your health care provider if you are at risk. Ask your health care provider about whether you are at high risk for HIV. Your health  care provider may recommend a prescription medicine to help prevent HIV infection. If you choose to take medicine to prevent HIV, you should first get tested for HIV. You should then be tested every 3 months for as long as you are taking the medicine. Follow these instructions at home: Alcohol use Do not drink alcohol if your health care provider tells you not to drink. If you drink alcohol: Limit how much you have to 0-2 drinks a day. Know how much alcohol is in your drink. In the [place] one drink equals one 12 oz bottle of beer (355 mL), one 5 oz glass of wine (148 mL), or one 1 oz glass of hard liquor (44 mL). Lifestyle Do not use any products that contain nicotine or tobacco. These products include cigarettes, chewing tobacco, and vaping devices, such as e-cigarettes. If you need help quitting, ask your health care provider. Do not use street drugs. Do not share needles. Ask your health care provider for help if you need support or information about quitting drugs. General instructions Schedule regular health, dental, and eye exams. Stay current with your vaccines. Tell your health care provider if: You often feel depressed. You have ever been abused or do not feel safe at home. Summary Adopting a healthy lifestyle and getting preventive care are important in promoting health and wellness. Follow your health care provider's instructions about healthy diet, exercising, and getting tested or screened for diseases. Follow your health care provider's instructions on monitoring your cholesterol and blood pressure. This information is not intended to replace advice given to you by your health care provider. Make sure you discuss any questions you have with your health care provider. Document Revised: 06/12/2020 Document Reviewed: 06/12/2020 Elsevier Patient Education  2024 [employer].

## 2024-02-15 LAB — CMP14+EGFR
ALT: 73 IU/L — ABNORMAL HIGH (ref 0–44)
AST: 56 IU/L — ABNORMAL HIGH (ref 0–40)
Albumin: 4.9 g/dL (ref 3.8–4.9)
Alkaline Phosphatase: 76 IU/L (ref 47–123)
BUN/Creatinine Ratio: 13 (ref 9–20)
BUN: 14 mg/dL (ref 6–24)
Bilirubin Total: 0.4 mg/dL (ref 0.0–1.2)
CO2: 25 mmol/L (ref 20–29)
Calcium: 9.7 mg/dL (ref 8.7–10.2)
Chloride: 104 mmol/L (ref 96–106)
Creatinine, Ser: 1.08 mg/dL (ref 0.76–1.27)
Globulin, Total: 2 g/dL (ref 1.5–4.5)
Glucose: 95 mg/dL (ref 70–99)
Potassium: 4.8 mmol/L (ref 3.5–5.2)
Sodium: 144 mmol/L (ref 134–144)
Total Protein: 6.9 g/dL (ref 6.0–8.5)
eGFR: 83 mL/min/1.73

## 2024-02-15 LAB — PSA: Prostate Specific Ag, Serum: 0.6 ng/mL (ref 0.0–4.0)

## 2024-02-15 LAB — CBC
Hematocrit: 49.5 % (ref 37.5–51.0)
Hemoglobin: 16 g/dL (ref 13.0–17.7)
MCH: 31.3 pg (ref 26.6–33.0)
MCHC: 32.3 g/dL (ref 31.5–35.7)
MCV: 97 fL (ref 79–97)
Platelets: 198 x10E3/uL (ref 150–450)
RBC: 5.11 x10E6/uL (ref 4.14–5.80)
RDW: 12.8 % (ref 11.6–15.4)
WBC: 3.6 x10E3/uL (ref 3.4–10.8)

## 2024-02-15 LAB — QUANTIFERON-TB GOLD PLUS
QuantiFERON Mitogen Value: >10 [IU]/mL
QuantiFERON Nil Value: 0.03 [IU]/mL
QuantiFERON TB1 Ag Value: 0.04 [IU]/mL
QuantiFERON TB2 Ag Value: 0.03 [IU]/mL
QuantiFERON-TB Gold Plus: NEGATIVE

## 2024-02-15 LAB — HEMOGLOBIN A1C
Est. average glucose Bld gHb Est-mCnc: 108 mg/dL
Hgb A1c MFr Bld: 5.4 % (ref 4.8–5.6)

## 2024-02-15 LAB — LIPID PANEL
Chol/HDL Ratio: 2 ratio (ref 0.0–5.0)
Cholesterol, Total: 152 mg/dL (ref 100–199)
HDL: 75 mg/dL
LDL Chol Calc (NIH): 66 mg/dL (ref 0–99)
Triglycerides: 50 mg/dL (ref 0–149)
VLDL Cholesterol Cal: 11 mg/dL (ref 5–40)

## 2024-02-16 ENCOUNTER — Ambulatory Visit: Payer: Self-pay | Admitting: Internal Medicine

## 2024-02-17 ENCOUNTER — Telehealth: Payer: Self-pay

## 2024-02-17 NOTE — Telephone Encounter (Signed)
"   PA for zepbound  0.5mg  has been submitted through [employer]. We are waiting on the determination. YL,RMA "

## 2024-02-17 NOTE — Telephone Encounter (Signed)
 I called and left pt a vm. His PA for zepbound  has been approved and he needs to come in on Friday for a nurse visit for zepbound  teaching. YL,RMA

## 2024-02-20 ENCOUNTER — Ambulatory Visit

## 2024-02-20 VITALS — BP 110/70 | HR 87 | Temp 98.5°F | Ht 73.0 in | Wt 209.0 lb

## 2024-02-20 DIAGNOSIS — G4730 Sleep apnea, unspecified: Secondary | ICD-10-CM

## 2024-02-20 NOTE — Progress Notes (Addendum)
 Patient is in office today for a nurse visit for Medication Education. Patient was provided with instruction and demonstration on how to Administer Medication. Patient verbualized understanding on how to use Zepbound . Patient directed to take shot once a week on the same day weekly. Patient showed how to administer with demo pen.

## 2024-02-21 ENCOUNTER — Encounter: Payer: Self-pay | Admitting: Internal Medicine

## 2024-02-21 DIAGNOSIS — Z6827 Body mass index (BMI) 27.0-27.9, adult: Secondary | ICD-10-CM | POA: Insufficient documentation

## 2024-02-21 NOTE — Assessment & Plan Note (Signed)
 Chronic, currently on rosuvastatin .  - Follow heart healthy lifestyle.

## 2024-02-21 NOTE — Assessment & Plan Note (Signed)
 A full exam was performed.  DRE performed, stool is heme negative.  He is advised to get 30-45 minutes of regular exercise, no less than four to five days per week. Both weight-bearing and aerobic exercises are recommended.  He is advised to follow a healthy diet with at least six fruits/veggies per day, decrease intake of red meat and other saturated fats and to increase fish intake to twice weekly.  Meats/fish should not be fried -- baked, boiled or broiled is preferable. It is also important to cut back on your sugar intake.  Be sure to read labels - try to avoid anything with added sugar, high fructose corn syrup or other sweeteners.  If you must use a sweetener, you can try stevia or monkfruit.  It is also important to avoid artificially sweetened foods/beverages and diet drinks. Lastly, wear SPF 50 sunscreen on exposed skin and when in direct sunlight for an extended period of time.  Be sure to avoid fast food restaurants and aim for at least 60 ounces of water daily.

## 2024-02-21 NOTE — Assessment & Plan Note (Addendum)
 Taking Lexapro . Discussed potential interaction with trazodone . Advised monitoring for serotonin syndrome symptoms. - Consider reducing Lexapro  to half a pill in the morning. - May need to switch to Wellbutrin or hydroxyzine. - Monitor for symptoms of serotonin syndrome.

## 2024-02-21 NOTE — Assessment & Plan Note (Signed)
 Obstructive sleep apnea. Discussed potential benefits of GLP-1 therapy with CPAP use. - Initiated Zepbound  for sleep apnea. - Will send rx 5mg  Zepbound  to start PA process - If approved, will start with 2.5mg  sample - Encouraged to stop eating when full. - Optimize protein intake.

## 2024-02-21 NOTE — Assessment & Plan Note (Signed)
 Weight gain of 30 pounds, currently at 210 pounds. Considering GLP-1 receptor agonist therapy. No family history of thyroid cancer, a contraindication for GLP-1 therapy. Discussed insurance coverage and cash pricing for GLP-1 therapy. - Initiated Zepbound  to address sleep apnea, which will benefit weight management, pending insurance approval. - Discussed cash pricing options for GLP-1 therapy if insurance does not cover.

## 2024-03-11 ENCOUNTER — Ambulatory Visit: Payer: Self-pay | Admitting: Internal Medicine

## 2024-03-11 NOTE — Patient Instructions (Incomplete)
 Breathing Problems While Sleeping (Sleep Apnea): What to Know  Sleep apnea is a condition that affects your breathing while you are sleeping. Your tongue or soft tissue in your throat may block the flow of air while you sleep. You may have shallow breathing or stop breathing for short periods of time. People with sleep apnea may snore loudly. There are three kinds of sleep apnea: Obstructive sleep apnea. This kind is caused by a blocked or collapsed airway. This is the most common. Central sleep apnea. This kind happens when the part of the brain that controls breathing does not send the correct signals to the muscles that control breathing. Mixed sleep apnea. This is a combination of obstructive and central sleep apnea. What are the causes? The most common cause of sleep apnea is a collapsed or blocked airway. What increases the risk? Being very overweight. Having family members with sleep apnea. Having a tongue or tonsils that are larger than normal. Having a small airway or jaw problems. Being older. What are the signs or symptoms? Loud snoring. Restless sleep. Trouble staying asleep. Being sleepy or tired during the day. Waking up gasping or choking. Having a headache in the morning. Mood swings. Having a hard time remembering things and concentrating. How is this diagnosed? A medical history. A physical exam. A sleep study. This is also called a polysomnography test. This test is done at a sleep lab or in your home while you are sleeping. How is this treated? Treatment may include: Sleeping on your side. Losing weight if you're overweight. Wearing an oral appliance. This is a mouthpiece that moves your lower jaw forward. Using a positive airway pressure (PAP) device to keep your airways open while you sleep, such as: A continuous positive airway pressure (CPAP) device. This device gives forced air through a mask when you breathe out. This keeps your airways open. A bilevel  positive airway pressure (BIPAP) device. This device gives forced air through a mask when you breathe in and when you breathe out to keep your airways open. Having surgery if other treatments do not work. If your sleep apnea is not treated, you may be at risk for: Heart failure. Heart attack. Stroke. Type 2 diabetes or a problem with your blood sugar called insulin resistance. Follow these instructions at home: Medicines Take your medicines only as told by your health care provider. Avoid alcohol, medicines to help you relax, and certain pain medicines. These may make sleep apnea worse. General instructions Do not smoke, vape, or use products with nicotine or tobacco in them. If you need help quitting, talk with your provider. If you were given a PAP device to open your airway while you sleep, use it as told by your provider. If you're having surgery, make sure to tell your provider you have sleep apnea. You may need to bring your PAP device with you. Contact a health care provider if: The PAP device that you were given to use during sleep bothers you or does not seem to be working. You do not feel better or you feel worse. Get help right away if: You have trouble breathing. You have chest pain. You have trouble talking. One side of your body feels weak. A part of your face is hanging down. These symptoms may be an emergency. Call 911 right away. Do not wait to see if the symptoms will go away. Do not drive yourself to the hospital. This information is not intended to replace advice given to you  by your health care provider. Make sure you discuss any questions you have with your health care provider. Document Revised: 12/04/2023 Document Reviewed: 03/28/2022 Elsevier Patient Education  2025 [employer].

## 2024-03-11 NOTE — Progress Notes (Unsigned)
 I,Curtis [name], CMA,acting as a neurosurgeon for Curtis LOISE Slocumb, MD.,have documented all relevant documentation on the behalf of Curtis LOISE Slocumb, MD,as directed by  Curtis LOISE Slocumb, MD while in the presence of Curtis LOISE Slocumb, MD.  Subjective:  Patient ID: Curtis [name] , male    DOB: [birth year] , 54 y.o.   MRN: [id number]  No chief complaint on file.   HPI  HPI   Past Medical History:  Diagnosis Date   Acne    Allergic rhinitis    GERD (gastroesophageal reflux disease)    Insomnia    Olecranon bursitis    Pure hypercholesterolemia    Sleep apnea    Vitamin D deficiency      Family History  Problem Relation Age of Onset   Healthy Mother    Hypertension Father    High Cholesterol Father    CAD Father    Heart attack Father    Heart disease Father    Healthy Brother     Current Medications[1]   Allergies[2]   Review of Systems  Constitutional: Negative.   Respiratory: Negative.    Gastrointestinal: Negative.   Skin: Negative.   Allergic/Immunologic: Negative.   Hematological: Negative.      There were no vitals filed for this visit. There is no height or weight on file to calculate BMI.  Wt Readings from Last 3 Encounters:  02/20/24 209 lb (94.8 kg)  02/12/24 209 lb 9.6 oz (95.1 kg)  12/15/23 211 lb (95.7 kg)    The 10-year ASCVD risk score (Arnett DK, et al., 2019) is: 1.3%   Values used to calculate the score:     Age: 70 years     Clinically relevant sex: Male     Is Non-Hispanic African American: No     Diabetic: No     Tobacco smoker: No     Systolic Blood Pressure: 110 mmHg     Is BP treated: No     HDL Cholesterol: 75 mg/dL     Total Cholesterol: 152 mg/dL  Objective:  Physical Exam      Assessment And Plan:   Assessment & Plan Sleep apnea with use of continuous positive airway pressure (CPAP)   No orders of the defined types were placed in this encounter.    No follow-ups on file.  Patient was given opportunity to  ask questions. Patient verbalized understanding of the plan and was able to repeat key elements of the plan. All questions were answered to their satisfaction.    I, Curtis LOISE Slocumb, MD, have reviewed all documentation for this visit. The documentation on 03/11/24 for the exam, diagnosis, procedures, and orders are all accurate and complete.   IF YOU HAVE BEEN REFERRED TO A SPECIALIST, IT MAY TAKE 1-2 WEEKS TO SCHEDULE/PROCESS THE REFERRAL. IF YOU HAVE NOT HEARD FROM US /SPECIALIST IN TWO WEEKS, PLEASE GIVE US  A CALL AT [phone].      [1]  Current Outpatient Medications:    Cholecalciferol (VITAMIN D) 2000 units CAPS, Take 1 capsule by mouth daily., Disp: , Rfl:    escitalopram  (LEXAPRO ) 10 MG tablet, TAKE 1 TABLET BY MOUTH DAILY, Disp: 90 tablet, Rfl: 3   Omega-3 Fatty Acids (FISH OIL) 500 MG CAPS, Take 1 capsule by mouth daily., Disp: , Rfl:    rosuvastatin  (CRESTOR ) 5 MG tablet, TAKE 1 TABLET BY MOUTH DAILY, Disp: 90 tablet, Rfl: 3   temazepam  (RESTORIL ) 15 MG capsule, TAKE 1 CAPSULE BY MOUTH AT  BEDTIME AS NEEDED FOR SLEEP, Disp: 60 capsule, Rfl: 1   tirzepatide  (ZEPBOUND ) 5 MG/0.5ML Pen, Inject 5 mg into the skin once a week., Disp: 2 mL, Rfl: 0   traZODone  (DESYREL ) 50 MG tablet, Take 1 tablet (50 mg total) by mouth at bedtime., Disp: 90 tablet, Rfl: 0 [2] No Known Allergies

## 2024-03-16 ENCOUNTER — Ambulatory Visit: Payer: Self-pay

## 2024-08-17 ENCOUNTER — Ambulatory Visit: Payer: Self-pay | Admitting: Internal Medicine

## 2025-01-10 ENCOUNTER — Ambulatory Visit: Admitting: Family Medicine

## 2025-02-16 ENCOUNTER — Encounter: Payer: Self-pay | Admitting: Internal Medicine
